# Patient Record
Sex: Male | Born: 1945 | Race: Black or African American | Hispanic: No | Marital: Married | Smoking: Former smoker
Health system: Southern US, Community
[De-identification: ages and names within clinical notes are randomized; demographics above are authoritative.]

## PROBLEM LIST (undated history)

## (undated) DIAGNOSIS — I10 Essential (primary) hypertension: Secondary | ICD-10-CM

## (undated) DIAGNOSIS — IMO0001 Reserved for inherently not codable concepts without codable children: Secondary | ICD-10-CM

## (undated) DIAGNOSIS — I2510 Atherosclerotic heart disease of native coronary artery without angina pectoris: Secondary | ICD-10-CM

## (undated) DIAGNOSIS — I499 Cardiac arrhythmia, unspecified: Secondary | ICD-10-CM

## (undated) DIAGNOSIS — K7460 Unspecified cirrhosis of liver: Secondary | ICD-10-CM

## (undated) DIAGNOSIS — I509 Heart failure, unspecified: Secondary | ICD-10-CM

## (undated) DIAGNOSIS — E7800 Pure hypercholesterolemia, unspecified: Secondary | ICD-10-CM

---
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
## 1998-03-10 ENCOUNTER — Ambulatory Visit (HOSPITAL_COMMUNITY): Admission: RE | Admit: 1998-03-10 | Discharge: 1998-03-10 | Payer: Self-pay | Admitting: Family Medicine

## 1998-08-23 ENCOUNTER — Encounter: Payer: Self-pay | Admitting: Emergency Medicine

## 1998-08-23 ENCOUNTER — Emergency Department (HOSPITAL_COMMUNITY): Admission: EM | Admit: 1998-08-23 | Discharge: 1998-08-23 | Payer: Self-pay | Admitting: Emergency Medicine

## 2000-11-13 ENCOUNTER — Ambulatory Visit (HOSPITAL_COMMUNITY): Admission: RE | Admit: 2000-11-13 | Discharge: 2000-11-13 | Payer: Self-pay | Admitting: Family Medicine

## 2000-11-13 ENCOUNTER — Encounter: Payer: Self-pay | Admitting: Family Medicine

## 2000-11-22 ENCOUNTER — Encounter: Payer: Self-pay | Admitting: Family Medicine

## 2000-11-22 ENCOUNTER — Ambulatory Visit (HOSPITAL_COMMUNITY): Admission: RE | Admit: 2000-11-22 | Discharge: 2000-11-22 | Payer: Self-pay | Admitting: Family Medicine

## 2000-12-28 ENCOUNTER — Inpatient Hospital Stay (HOSPITAL_COMMUNITY): Admission: EM | Admit: 2000-12-28 | Discharge: 2001-01-03 | Payer: Self-pay | Admitting: Emergency Medicine

## 2000-12-28 ENCOUNTER — Encounter: Payer: Self-pay | Admitting: Cardiology

## 2004-04-25 ENCOUNTER — Encounter: Admission: RE | Admit: 2004-04-25 | Discharge: 2004-07-24 | Payer: Self-pay | Admitting: Internal Medicine

## 2004-05-26 ENCOUNTER — Ambulatory Visit (HOSPITAL_COMMUNITY): Admission: RE | Admit: 2004-05-26 | Discharge: 2004-05-26 | Payer: Self-pay | Admitting: Gastroenterology

## 2007-01-23 ENCOUNTER — Emergency Department (HOSPITAL_COMMUNITY): Admission: EM | Admit: 2007-01-23 | Discharge: 2007-01-23 | Payer: Self-pay | Admitting: Emergency Medicine
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
## 2011-08-28 ENCOUNTER — Other Ambulatory Visit: Payer: Self-pay | Admitting: Cardiology

## 2011-09-08 ENCOUNTER — Other Ambulatory Visit: Payer: Self-pay | Admitting: Cardiology

## 2011-09-24 ENCOUNTER — Encounter: Payer: Self-pay | Admitting: *Deleted

## 2011-09-24 ENCOUNTER — Emergency Department (INDEPENDENT_AMBULATORY_CARE_PROVIDER_SITE_OTHER)
Admission: EM | Admit: 2011-09-24 | Discharge: 2011-09-24 | Disposition: A | Discharge status: Home / Self Care | Payer: Self-pay | Source: Home / Self Care | Attending: Emergency Medicine | Admitting: Emergency Medicine

## 2011-09-24 ENCOUNTER — Emergency Department (INDEPENDENT_AMBULATORY_CARE_PROVIDER_SITE_OTHER): Payer: Self-pay

## 2011-09-24 DIAGNOSIS — S6990XA Unspecified injury of unspecified wrist, hand and finger(s), initial encounter: Secondary | ICD-10-CM

## 2011-09-24 DIAGNOSIS — IMO0001 Reserved for inherently not codable concepts without codable children: Secondary | ICD-10-CM

## 2011-09-24 DIAGNOSIS — N342 Other urethritis: Secondary | ICD-10-CM

## 2011-09-24 HISTORY — DX: Heart failure, unspecified: I50.9

## 2011-09-24 HISTORY — DX: Cardiac arrhythmia, unspecified: I49.9

## 2011-09-24 HISTORY — DX: Atherosclerotic heart disease of native coronary artery without angina pectoris: I25.10

## 2011-09-24 HISTORY — DX: Essential (primary) hypertension: I10

## 2011-09-24 HISTORY — DX: Pure hypercholesterolemia, unspecified: E78.00

## 2011-09-24 MED ORDER — AZITHROMYCIN 250 MG PO TABS
1000.0000 mg | ORAL_TABLET | Freq: Once | ORAL | Status: AC
  Administered 2011-09-24: 1000 mg via ORAL

## 2011-09-24 MED ORDER — CEFIXIME 400 MG PO TABS
400.0000 mg | ORAL_TABLET | Freq: Once | ORAL | Status: AC
  Administered 2011-09-24: 400 mg via ORAL

## 2011-09-24 MED ORDER — AZITHROMYCIN 250 MG PO TABS
ORAL_TABLET | ORAL | Status: AC
  Filled 2011-09-24: qty 4

## 2011-09-24 MED ORDER — CEFIXIME 400 MG PO TABS
ORAL_TABLET | ORAL | Status: AC
  Filled 2011-09-24: qty 1
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
## 2011-09-24 NOTE — ED Notes (Signed)
Left ring finger injury x 2 months ago no treatment at time of injury per pt continued pain decreased ROM

## 2011-09-24 NOTE — ED Notes (Signed)
After triaging pt and leaving room pt stated he also wanted to be seen for penile discharge

## 2011-09-25 LAB — GC/CHLAMYDIA PROBE AMP, GENITAL
Chlamydia, DNA Probe: NEGATIVE
GC Probe Amp, Genital: NEGATIVE

## 2012-02-19 ENCOUNTER — Emergency Department (HOSPITAL_COMMUNITY)
Admission: EM | Admit: 2012-02-19 | Discharge: 2012-02-19 | Disposition: A | Discharge status: Home / Self Care | Payer: Federal, State, Local not specified - PPO | Source: Home / Self Care | Attending: Family Medicine | Admitting: Family Medicine

## 2012-02-19 ENCOUNTER — Encounter (HOSPITAL_COMMUNITY): Payer: Self-pay | Admitting: Cardiology

## 2012-02-19 DIAGNOSIS — L739 Follicular disorder, unspecified: Secondary | ICD-10-CM

## 2012-02-19 DIAGNOSIS — L738 Other specified follicular disorders: Secondary | ICD-10-CM

## 2012-02-19 MED ORDER — SULFAMETHOXAZOLE-TRIMETHOPRIM 800-160 MG PO TABS: 1.0000 | ORAL_TABLET | Freq: Two times a day (BID) | ORAL | Status: AC

## 2012-02-19 NOTE — ED Notes (Signed)
Pr reports raised rash to chin that started 3 days ago. Area is noted to be raised with one area to the center of the chin that is open that the pt removed with fingers. Pt denies itching to the area. One raised area to the right groin that is healing that had the same appearance as rash to face. Denies fever.

## 2012-02-19 NOTE — ED Provider Notes (Signed)
History     CSN: [id number]  Arrival date & time 02/19/12  1110   First MD Initiated Contact with Patient 02/19/12 1318      Chief Complaint  Patient presents with  . Rash    (Consider location/radiation/quality/duration/timing/severity/associated sxs/prior treatment) HPI Comments: The patient reports having a skin infection on his chin and groin. Noted 2 pustules 3 days ago. No pain. States the one on his chin drained. No known hx of mrsa. No tx pta.   The history is provided by the patient.    Past Medical History  Diagnosis Date  . Hypertension   . Diabetes mellitus   . Coronary artery disease   . CHF (congestive heart failure)   . Irregular heartbeat   . High cholesterol     Past Surgical History  Procedure Date  . Tonsillectomy 1965    History reviewed. No pertinent family history.  History  Substance Use Topics  . Smoking status: Former Smoker    Quit date: 12/05/1967  . Smokeless tobacco: Not on file  . Alcohol Use: Yes     occas      Review of Systems  Constitutional: Negative.   HENT: Negative.   Cardiovascular: Negative.   Gastrointestinal: Negative.   Genitourinary: Negative.     Allergies  Review of patient's allergies indicates no known allergies.  Home Medications   Current Outpatient Rx  Name Route Sig Dispense Refill  . ASPIRIN 81 MG PO TABS Oral Take 81 mg by mouth daily.      Marland Kitchen CARVEDILOL PHOSPHATE ER 80 MG PO CP24 Oral Take 80 mg by mouth daily.      Marland Kitchen DIGOXIN 0.25 MG PO TABS Oral Take 250 mcg by mouth daily.      . FUROSEMIDE 80 MG PO TABS Oral Take 80 mg by mouth 2 (two) times daily.      Marland Kitchen GLIPIZIDE 10 MG PO TABS Oral Take 10 mg by mouth 2 (two) times daily before a meal.      . METFORMIN HCL 500 MG PO TABS Oral Take 500 mg by mouth 2 (two) times daily with a meal.      . POTASSIUM CHLORIDE CRYS ER 20 MEQ PO TBCR Oral Take 20 mEq by mouth 2 (two) times daily.      Marland Kitchen RAMIPRIL 10 MG PO TABS Oral Take 10 mg by mouth daily.        Marland Kitchen ROSUVASTATIN CALCIUM 20 MG PO TABS Oral Take 20 mg by mouth daily.      . SULFAMETHOXAZOLE-TRIMETHOPRIM 800-160 MG PO TABS Oral Take 1 tablet by mouth every 12 (twelve) hours. 20 tablet 0    There were no vitals taken for this visit.  Physical Exam  Nursing note and vitals reviewed. Constitutional: He appears well-developed and well-nourished. No distress.  HENT:  Head: Normocephalic and atraumatic.       Small scabbed lesion on chin.   Neck: Normal range of motion. Neck supple.  Cardiovascular: Normal rate and regular rhythm.   Pulmonary/Chest: Breath sounds normal.  Skin: Skin is warm and dry.       Small red raised area consistent with folliculitis right scrotum    ED Course  Procedures (including critical care time)  Labs Reviewed - No data to display No results found.   1. Folliculitis       MDM          Randa Spike, MD 02/19/12 [phone]

## 2012-02-19 NOTE — Discharge Instructions (Signed)
Apply antibiotic ointment to chin. Follow up if symptoms persist or worsen.

## 2012-02-21 ENCOUNTER — Telehealth (HOSPITAL_COMMUNITY): Payer: Self-pay | Admitting: *Deleted

## 2012-02-21 NOTE — ED Notes (Addendum)
Pt. called on VM and wanted to speak to Dr. Lorenza Chick. 1426  I called pt. back and he said his partner was diagnosed with trich. yesterday and wanted to ask Dr. Lorenza Chick for a Rx. to treat it. I asked what he was seen for and he said he was given Sulfamethoxazole for sores on his face. I told him that is a completely different problem than what we saw him for and he would have to be seen again. Pt. said he just paid a copay on Mon. Of $40.00. I told him I would ask the doctor and call him back. Discussed with Dr. Artis Flock and he said pt. can f/u at the [facility]. I called pt. back and gave him this information. I told him he could also come back here. [name] 02/21/2012

## 2012-12-16 ENCOUNTER — Emergency Department (INDEPENDENT_AMBULATORY_CARE_PROVIDER_SITE_OTHER): Payer: Federal, State, Local not specified - PPO

## 2012-12-16 ENCOUNTER — Emergency Department (HOSPITAL_COMMUNITY)
Admission: EM | Admit: 2012-12-16 | Discharge: 2012-12-16 | Disposition: A | Discharge status: Home / Self Care | Payer: Federal, State, Local not specified - PPO | Source: Home / Self Care | Attending: Emergency Medicine | Admitting: Emergency Medicine

## 2012-12-16 ENCOUNTER — Encounter (HOSPITAL_COMMUNITY): Payer: Self-pay | Admitting: *Deleted

## 2012-12-16 DIAGNOSIS — N490 Inflammatory disorders of seminal vesicle: Secondary | ICD-10-CM

## 2012-12-16 DIAGNOSIS — R6882 Decreased libido: Secondary | ICD-10-CM

## 2012-12-16 LAB — POCT I-STAT, CHEM 8
Calcium, Ion: 1.12 mmol/L — ABNORMAL LOW (ref 1.13–1.30)
Chloride: 101 mEq/L (ref 96–112)
HCT: 42 % (ref 39.0–52.0)
Hemoglobin: 14.3 g/dL (ref 13.0–17.0)
TCO2: 35 mmol/L (ref 0–100)

## 2012-12-16 LAB — POCT URINALYSIS DIP (DEVICE)
Bilirubin Urine: NEGATIVE
Leukocytes, UA: NEGATIVE
Nitrite: NEGATIVE
Urobilinogen, UA: 0.2 mg/dL (ref 0.0–1.0)
pH: 5.5 (ref 5.0–8.0)

## 2012-12-16 MED ORDER — TRAMADOL HCL 50 MG PO TABS: 100.0000 mg | ORAL_TABLET | Freq: Three times a day (TID) | ORAL | Status: DC | PRN

## 2012-12-16 MED ORDER — METHOCARBAMOL 500 MG PO TABS: 500.0000 mg | ORAL_TABLET | Freq: Three times a day (TID) | ORAL | Status: DC

## 2012-12-16 NOTE — ED Provider Notes (Signed)
Chief Complaint:   Chief Complaint  Patient presents with  . Back Pain    History of Present Illness:   Geoffrey [name] is a 67 year old gentleman who recalls that 2 months ago he had an episode of bright red blood in his ejaculate. He denies any pain with ejaculation, dysuria, frequency, urgency, hesitancy, decrease in his urinary stream, or hematuria. Ever since that time he's had pain in his back which began the upper back and seems to down to lower back. He describes it as a back spasm or muscle spasm. Is worse in the morning when he first wakes up and radiates around to both flank areas. His left leg feels numb in the morning but then this gets better. The pain does not radiate down into the leg. There is no weakness. The pain is better with massage. He's also noted since then decrease in his libido. He denies any difficulty getting an erection.  Review of Systems:  Other than noted above, the patient denies any of the following symptoms: Systemic:  No fever, chills, severe fatigue, or unexplained weight loss. GI:  No abdominal pain, nausea, vomiting, diarrhea, constipation, incontinence of bowel, or blood in stool. GU:  No dysuria, frequency, urgency, or hematuria. No incontinence of urine or difficulty urinating.  M-S:  No neck pain, joint pain, arthritis, or myalgias. Neuro:  No paresthesias, saddle anesthesia, muscular weakness, or progressive neurological deficit.  PMFSH:  Past medical history, family history, social history, meds, and allergies were reviewed. Specifically, there is no history of cancer, major trauma, osteoporosis, immunosuppression, HIV, or IV or injection drug use. He has diabetes and congestive heart failure and is on a number of meds including aspirin, carvedilol, digoxin, furosemide, glipizide, metformin, ramipril, potassium, and Crestor.  Physical Exam:   Vital signs:  BP 152/76  Pulse 78  Temp(Src) 98.6 F (37 C)  Resp 18  SpO2 99% General:  Alert, oriented,  in no distress. Lungs: Clear to auscultation. Heart: Rate rhythm, no gallop or murmur. Abdomen:  Soft, non-tender.  No organomegaly or mass.  No pulsatile midline abdominal mass or bruit. Rectal exam: No tenderness or mass, prostate was normal without any nodules or tenderness, and stool was heme-negative. Back:  Exam of the back reveals no tenderness to palpation, with full range of motion, minimal pain with flexion, negative straight leg raising. Neuro:  Normal muscle strength, sensations and DTRs. Extremities: Pedal pulses were full, there was no edema. Skin:  Clear, warm and dry.  No rash.  Labs:   Results for orders placed during the hospital encounter of 12/16/12  OCCULT BLOOD, POC DEVICE      Result Value Range   Fecal Occult Bld NEGATIVE  NEGATIVE  POCT URINALYSIS DIP (DEVICE)      Result Value Range   Glucose, UA NEGATIVE  NEGATIVE mg/dL   Bilirubin Urine NEGATIVE  NEGATIVE   Ketones, ur TRACE (*) NEGATIVE mg/dL   Specific Gravity, Urine 1.015  1.005 - 1.030   Hgb urine dipstick NEGATIVE  NEGATIVE   pH 5.5  5.0 - 8.0   Protein, ur 30 (*) NEGATIVE mg/dL   Urobilinogen, UA 0.2  0.0 - 1.0 mg/dL   Nitrite NEGATIVE  NEGATIVE   Leukocytes, UA NEGATIVE  NEGATIVE  POCT I-STAT, CHEM 8      Result Value Range   Sodium 143  135 - 145 mEq/L   Potassium 4.1  3.5 - 5.1 mEq/L   Chloride 101  96 - 112 mEq/L   BUN  18  6 - 23 mg/dL   Creatinine, Ser 7.82 (*) 0.50 - 1.35 mg/dL   Glucose, Bld 956 (*) 70 - 99 mg/dL   Calcium, Ion 2.13 (*) 1.13 - 1.30 mmol/L   TCO2 35  0 - 100 mmol/L   Hemoglobin 14.3  13.0 - 17.0 g/dL   HCT 08.6  57.8 - 46.9 %     Radiology:  Dg Lumbar Spine Complete  12/16/2012  *RADIOLOGY REPORT*  Clinical Data: Low back pain for the past 2 months.  No known injury.  LUMBAR SPINE - COMPLETE 4+ VIEW  Comparison: None.  Findings: Five non-rib bearing lumbar vertebrae.  Marked disc space narrowing with a vacuum phenomenon and mild to moderate anterior spur formation at  the L5-S1 level.  There are also facet degenerative changes at the L4-5 and L5-S1 levels with associated grade 1 spondylolisthesis at the L4-5 level.  No pars defects are seen.  Atheromatous arterial calcifications without visible aneurysm.  Lower thoracic spine degenerative changes.  IMPRESSION: Degenerative changes, as described above.   Original Report Authenticated By: Beckie Salts, M.D.     Assessment:  The primary encounter diagnosis was Degenerative disc disease, lumbar. Diagnoses of Decreased libido and Seminal vesiculitis were also pertinent to this visit.  His back pain appears to be due to degenerative disc disease. He has almost no disc left at his L5-S1 level. Additionally, he appears to have similar cellulitis as a cause for his hematospermia. 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  Plan:   1.  The following meds were prescribed:   Discharge Medication List as of 12/16/2012  4:54 PM    START taking these medications   Details  methocarbamol (ROBAXIN) 500 MG tablet Take 1 tablet (500 mg total) by mouth 3 (three) times daily., Starting 12/16/2012, Until Discontinued, Normal    traMADol (ULTRAM) 50 MG tablet Take 2 tablets (100 mg total) by mouth every 8 (eight) hours as needed for pain., Starting 12/16/2012, Until Discontinued, Normal       2.  The patient was instructed in symptomatic care and handouts were given. 3.  The patient was told to return if becoming worse in any way, if no better in 2 weeks, and given some red flag symptoms including fever, worsening pain, or neurological symptoms that would indicate earlier return. 4.  The patient was encouraged to try to be as active as possible and given some exercises to do followed by moist heat.  Follow up:  The patient was told to follow up with Dr. Annell Greening for his back problem and Dr. Margaretmary Bayley for his libido deficiency.     Reuben Likes,  MD 12/16/12 [phone]

## 2012-12-16 NOTE — ED Notes (Signed)
Geoffrey [name]  Reports  Symptoms  Of  Back  Pain  With          The  Pain  In the  r  Upper  Part   Of  His  Back    He  Ambulates  With a  Steady  Fluid  Gait  He  Is  Sitting uprigght on the  Exam table  He  Is  Speaking in complete  sentances   He  Is  In no   severe  Distress

## 2014-12-15 ENCOUNTER — Inpatient Hospital Stay (HOSPITAL_COMMUNITY)
Admission: EM | Admit: 2014-12-15 | Discharge: 2014-12-22 | DRG: 292 | Disposition: A | Discharge status: Home / Self Care | Payer: Medicare Other | Attending: Cardiology | Admitting: Cardiology

## 2014-12-15 ENCOUNTER — Encounter (HOSPITAL_COMMUNITY): Payer: Self-pay | Admitting: Emergency Medicine

## 2014-12-15 DIAGNOSIS — E785 Hyperlipidemia, unspecified: Secondary | ICD-10-CM | POA: Diagnosis not present

## 2014-12-15 DIAGNOSIS — I5023 Acute on chronic systolic (congestive) heart failure: Secondary | ICD-10-CM | POA: Diagnosis present

## 2014-12-15 DIAGNOSIS — N189 Chronic kidney disease, unspecified: Secondary | ICD-10-CM

## 2014-12-15 DIAGNOSIS — D638 Anemia in other chronic diseases classified elsewhere: Secondary | ICD-10-CM | POA: Diagnosis present

## 2014-12-15 DIAGNOSIS — K7031 Alcoholic cirrhosis of liver with ascites: Secondary | ICD-10-CM | POA: Diagnosis not present

## 2014-12-15 DIAGNOSIS — D689 Coagulation defect, unspecified: Secondary | ICD-10-CM | POA: Diagnosis present

## 2014-12-15 DIAGNOSIS — I2510 Atherosclerotic heart disease of native coronary artery without angina pectoris: Secondary | ICD-10-CM | POA: Diagnosis not present

## 2014-12-15 DIAGNOSIS — N289 Disorder of kidney and ureter, unspecified: Secondary | ICD-10-CM

## 2014-12-15 DIAGNOSIS — I129 Hypertensive chronic kidney disease with stage 1 through stage 4 chronic kidney disease, or unspecified chronic kidney disease: Secondary | ICD-10-CM | POA: Diagnosis present

## 2014-12-15 DIAGNOSIS — F1290 Cannabis use, unspecified, uncomplicated: Secondary | ICD-10-CM | POA: Diagnosis not present

## 2014-12-15 DIAGNOSIS — Z23 Encounter for immunization: Secondary | ICD-10-CM | POA: Diagnosis not present

## 2014-12-15 DIAGNOSIS — N179 Acute kidney failure, unspecified: Secondary | ICD-10-CM | POA: Diagnosis present

## 2014-12-15 DIAGNOSIS — N183 Chronic kidney disease, stage 3 (moderate): Secondary | ICD-10-CM | POA: Diagnosis not present

## 2014-12-15 DIAGNOSIS — K766 Portal hypertension: Secondary | ICD-10-CM | POA: Diagnosis present

## 2014-12-15 DIAGNOSIS — E876 Hypokalemia: Secondary | ICD-10-CM | POA: Diagnosis not present

## 2014-12-15 DIAGNOSIS — E11649 Type 2 diabetes mellitus with hypoglycemia without coma: Secondary | ICD-10-CM | POA: Diagnosis not present

## 2014-12-15 DIAGNOSIS — I420 Dilated cardiomyopathy: Secondary | ICD-10-CM | POA: Diagnosis present

## 2014-12-15 DIAGNOSIS — Z7982 Long term (current) use of aspirin: Secondary | ICD-10-CM

## 2014-12-15 DIAGNOSIS — F1010 Alcohol abuse, uncomplicated: Secondary | ICD-10-CM | POA: Diagnosis not present

## 2014-12-15 DIAGNOSIS — M7989 Other specified soft tissue disorders: Secondary | ICD-10-CM | POA: Diagnosis present

## 2014-12-15 DIAGNOSIS — I5021 Acute systolic (congestive) heart failure: Secondary | ICD-10-CM | POA: Diagnosis present

## 2014-12-15 DIAGNOSIS — Z87891 Personal history of nicotine dependence: Secondary | ICD-10-CM | POA: Diagnosis not present

## 2014-12-15 HISTORY — DX: Reserved for inherently not codable concepts without codable children: IMO0001

## 2014-12-15 HISTORY — DX: Unspecified cirrhosis of liver: K74.60

## 2014-12-15 LAB — CBC WITH DIFFERENTIAL/PLATELET
BASOS ABS: 0.1 10*3/uL (ref 0.0–0.1)
BASOS PCT: 1 % (ref 0–1)
EOS ABS: 0 10*3/uL (ref 0.0–0.7)
EOS PCT: 0 % (ref 0–5)
HEMATOCRIT: 29.8 % — AB (ref 39.0–52.0)
Hemoglobin: 8.6 g/dL — ABNORMAL LOW (ref 13.0–17.0)
Lymphocytes Relative: 17 % (ref 12–46)
Lymphs Abs: 1.1 10*3/uL (ref 0.7–4.0)
MCH: 22.6 pg — AB (ref 26.0–34.0)
MCHC: 28.9 g/dL — AB (ref 30.0–36.0)
MCV: 78.2 fL (ref 78.0–100.0)
MONO ABS: 0.5 10*3/uL (ref 0.1–1.0)
Monocytes Relative: 8 % (ref 3–12)
NEUTROS ABS: 4.7 10*3/uL (ref 1.7–7.7)
Neutrophils Relative %: 74 % (ref 43–77)
Platelets: 218 10*3/uL (ref 150–400)
RBC: 3.81 MIL/uL — ABNORMAL LOW (ref 4.22–5.81)
RDW: 18.3 % — AB (ref 11.5–15.5)
WBC: 6.4 10*3/uL (ref 4.0–10.5)

## 2014-12-15 LAB — COMPREHENSIVE METABOLIC PANEL
ALK PHOS: 104 U/L (ref 39–117)
ALT: 525 U/L — AB (ref 0–53)
AST: 826 U/L — AB (ref 0–37)
Albumin: 2.8 g/dL — ABNORMAL LOW (ref 3.5–5.2)
Anion gap: 10 (ref 5–15)
BUN: 27 mg/dL — ABNORMAL HIGH (ref 6–23)
CO2: 26 mmol/L (ref 19–32)
Calcium: 8.3 mg/dL — ABNORMAL LOW (ref 8.4–10.5)
Chloride: 106 mmol/L (ref 96–112)
Creatinine, Ser: 2.15 mg/dL — ABNORMAL HIGH (ref 0.50–1.35)
GFR calc non Af Amer: 30 mL/min — ABNORMAL LOW (ref >90)
GFR, EST AFRICAN AMERICAN: 35 mL/min — AB (ref >90)
GLUCOSE: 69 mg/dL — AB (ref 70–99)
POTASSIUM: 3.9 mmol/L (ref 3.5–5.1)
SODIUM: 142 mmol/L (ref 135–145)
TOTAL PROTEIN: 6.8 g/dL (ref 6.0–8.3)
Total Bilirubin: 2.1 mg/dL — ABNORMAL HIGH (ref 0.3–1.2)

## 2014-12-15 LAB — POC OCCULT BLOOD, ED: FECAL OCCULT BLD: NEGATIVE

## 2014-12-15 LAB — MAGNESIUM: Magnesium: 2.5 mg/dL (ref 1.5–2.5)

## 2014-12-15 LAB — CBG MONITORING, ED
GLUCOSE-CAPILLARY: 43 mg/dL — AB (ref 70–99)
GLUCOSE-CAPILLARY: 117 mg/dL — AB (ref 70–99)

## 2014-12-15 MED ORDER — DEXTROSE 50 % IV SOLN
50.0000 mL | Freq: Once | INTRAVENOUS | Status: AC
  Administered 2014-12-15: 50 mL via INTRAVENOUS
  Filled 2014-12-15: qty 50

## 2014-12-15 NOTE — H&P (Signed)
Geoffrey [name] is an 69 y.o. male.   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 Patient gives history of PND orthopnea. Denies any chest pain nausea vomiting diaphoresis. Denies abdominal pain. Patient was noted to be hypoglycemic with blood sugar in 40s and also was noted to have hemoglobin of 8.6 which was significant drop from 14.3 in March 2014. Patient denies any abdominal pain. Denies any hematuria or bright red blood per rectum or melena. Denies any hemoptysis.  Past Medical History  Diagnosis Date  . Hypertension   . Diabetes mellitus   . Coronary artery disease   . CHF (congestive heart failure)   . Irregular heartbeat   . High cholesterol     Past Surgical History  Procedure Laterality Date  . Tonsillectomy  1965    No family history on file. Social History:  reports that he quit smoking about 47 years ago. He does not have any smokeless tobacco history on file. He reports that he drinks alcohol. He reports that he uses illicit drugs (Marijuana) about 3 times per week.  Allergies: No Known Allergies   (Not in a hospital admission)  Results for orders placed or performed during the hospital encounter of 12/15/14 (from the past 48 hour(s))  CBG monitoring, ED     Status: Abnormal   Collection Time: 12/15/14  8:52 PM  Result Value Ref Range   Glucose-Capillary 43 (LL) 70 - 99 mg/dL  CBC with Differential     Status: Abnormal   Collection Time: 12/15/14  9:05 PM  Result  Value Ref Range   WBC 6.4 4.0 - 10.5 K/uL   RBC 3.81 (L) 4.22 - 5.81 MIL/uL   Hemoglobin 8.6 (L) 13.0 - 17.0 g/dL   HCT 29.8 (L) 39.0 - 52.0 %   MCV 78.2 78.0 - 100.0 fL   MCH 22.6 (L) 26.0 - 34.0 pg   MCHC 28.9 (L) 30.0 - 36.0 g/dL   RDW 18.3 (H) 11.5 - 15.5 %   Platelets 218 150 - 400 K/uL   Neutrophils Relative % 74 43 - 77 %   Neutro Abs 4.7 1.7 - 7.7 K/uL   Lymphocytes Relative 17 12 - 46 %   Lymphs Abs 1.1 0.7 - 4.0 K/uL   Monocytes Relative 8 3 - 12 %   Monocytes Absolute 0.5 0.1 - 1.0 K/uL   Eosinophils Relative 0 0 - 5 %   Eosinophils Absolute 0.0 0.0 - 0.7 K/uL   Basophils Relative 1 0 - 1 %   Basophils Absolute 0.1 0.0 - 0.1 K/uL  Comprehensive metabolic panel     Status: Abnormal   Collection Time: 12/15/14  9:05 PM  Result Value Ref Range   Sodium 142 135 - 145 mmol/L   Potassium 3.9 3.5 - 5.1 mmol/L   Chloride 106 96 - 112 mmol/L   CO2 26 19 - 32 mmol/L   Glucose, Bld 69 (L) 70 - 99 mg/dL   BUN 27 (H) 6 - 23 mg/dL   Creatinine, Ser 2.15 (H) 0.50 - 1.35 mg/dL   Calcium 8.3 (L) 8.4 - 10.5 mg/dL  Total Protein 6.8 6.0 - 8.3 g/dL   Albumin 2.8 (L) 3.5 - 5.2 g/dL   AST 826 (H) 0 - 37 U/L   ALT 525 (H) 0 - 53 U/L   Alkaline Phosphatase 104 39 - 117 U/L   Total Bilirubin 2.1 (H) 0.3 - 1.2 mg/dL   GFR calc non Af Amer 30 (L) >90 mL/min   GFR calc Af Amer 35 (L) >90 mL/min    Comment: (NOTE) The eGFR has been calculated using the CKD EPI equation. This calculation has not been validated in all clinical situations. eGFR's persistently <90 mL/min signify possible Chronic Kidney Disease.    Anion gap 10 5 - 15  Magnesium     Status: None   Collection Time: 12/15/14  9:05 PM  Result Value Ref Range   Magnesium 2.5 1.5 - 2.5 mg/dL  POC CBG, ED     Status: Abnormal   Collection Time: 12/15/14 10:06 PM  Result Value Ref Range   Glucose-Capillary 117 (H) 70 - 99 mg/dL   No results found.  Review of Systems  Constitutional: Negative for fever and chills.  HENT:  Negative for hearing loss.   Eyes: Negative for blurred vision and double vision.  Respiratory: Positive for cough and shortness of breath.   Cardiovascular: Positive for orthopnea, leg swelling and PND. Negative for chest pain and palpitations.  Gastrointestinal: Negative for nausea, vomiting and abdominal pain.  Genitourinary: Negative for dysuria.  Neurological: Negative for dizziness and headaches.    Blood pressure 117/67, pulse 79, temperature 97.8 F (36.6 C), temperature source Oral, resp. rate 18, height 5' 6"  (1.676 m), weight 74.39 kg (164 lb), SpO2 99 %. Physical Exam  Eyes: Pupils are equal, round, and reactive to light. Left eye exhibits no discharge. No scleral icterus.  Neck: Normal range of motion. Neck supple. JVD present. No tracheal deviation present.  Cardiovascular: Normal rate and regular rhythm.   Murmur (Soft systolic murmur and S3 gallop noted) heard. Respiratory:  Bibasilar rales noted  GI: Soft. Bowel sounds are normal. He exhibits no distension. There is no tenderness. There is no rebound.  Musculoskeletal:  No clubbing cyanosis 4+ edema noted     Assessment/Plan Acute decompensated systolic heart failure Severe nonischemic dilated cardiomyopathy Hypertension Diabetes mellitus Status post hypoglycemia EtOH abuse rule out cirrhosis of liver Elevated LFTs secondary to above Acute on chronic kidney injury Acute anemia questionable etiology rule out GI loss History of tobacco abuse Plan As per orders   [facility] N 12/15/2014, 10:17 PM

## 2014-12-15 NOTE — ED Notes (Signed)
Pt CBG 43. MD RN notified.

## 2014-12-15 NOTE — ED Notes (Signed)
[place] sandwich, apple sauce, apple juice, crackers, and peanut butter given to patient.

## 2014-12-15 NOTE — ED Notes (Signed)
Upon arrival nurse tech obtained CBG 43 notified EDP.

## 2014-12-15 NOTE — ED Notes (Signed)
Explained patient patient the need to get actual weight for patient verbalized understanding.

## 2014-12-15 NOTE — ED Notes (Signed)
Onset 4 weeks ago developed edema bilateral lower extremities. Doctor 3 weeks ago told patient to take an extra lasix pill.  Continue to have bilateral lower extremity edema. EMS reported patient blood sugar 37 gave instant glucose glucose increased to 47. Upon arrival glucose 43 EDP notified.

## 2014-12-15 NOTE — ED Provider Notes (Signed)
CSN: [id number]     Arrival date & time 12/15/14  2050 History   First MD Initiated Contact with Patient 12/15/14 2053     No chief complaint on file.    (Consider location/radiation/quality/duration/timing/severity/associated sxs/prior Treatment) HPI   69 y/o male w/ PMH (HTN, DM, CAD, CHF, HL) who comes in with complaint of bilateral lower extremity swelling for the past three weeks.    Patient has been seen pcp who had him take an extra dose of lasix two weeks ago, despite this, he has been continuing to have some worsening LE edema bilaterally.  Patient denies chest pain/shortness of breath, fever chills, increased skin redness/warmth, or other symptoms.  Past Medical History  Diagnosis Date  . Hypertension   . Diabetes mellitus   . Coronary artery disease   . CHF (congestive heart failure)   . Irregular heartbeat   . High cholesterol    Past Surgical History  Procedure Laterality Date  . Tonsillectomy  1965   No family history on file. History  Substance Use Topics  . Smoking status: Former Smoker    Quit date: 12/05/1967  . Smokeless tobacco: Not on file  . Alcohol Use: Yes     Comment: occas    Review of Systems  Constitutional: Negative for fever and chills.  Eyes: Negative for redness.  Respiratory: Negative for cough and shortness of breath.   Cardiovascular: Positive for leg swelling. Negative for chest pain.  Gastrointestinal: Negative for nausea, vomiting, abdominal pain and diarrhea.  Genitourinary: Negative for dysuria.  Skin: Negative for rash.  Neurological: Negative for headaches.  All other systems reviewed and are negative.     Allergies  Review of patient's allergies indicates no known allergies.  Home Medications   Prior to Admission medications   Medication Sig Start Date End Date Taking? Authorizing Provider  aspirin 81 MG tablet Take 81 mg by mouth daily.      Historical Provider, MD  carvedilol (COREG CR) 80 MG 24 hr capsule Take  80 mg by mouth daily.      Historical Provider, MD  digoxin (LANOXIN) 0.25 MG tablet Take 250 mcg by mouth daily.      Historical Provider, MD  furosemide (LASIX) 80 MG tablet Take 80 mg by mouth 2 (two) times daily.      Historical Provider, MD  glipiZIDE (GLUCOTROL) 10 MG tablet Take 10 mg by mouth 2 (two) times daily before a meal.      Historical Provider, MD  metFORMIN (GLUCOPHAGE) 500 MG tablet Take 500 mg by mouth 2 (two) times daily with a meal.      Historical Provider, MD  methocarbamol (ROBAXIN) 500 MG tablet Take 1 tablet (500 mg total) by mouth 3 (three) times daily. 12/16/12   Reuben Likes, MD  potassium chloride SA (K-DUR,KLOR-CON) 20 MEQ tablet Take 20 mEq by mouth 2 (two) times daily.      Historical Provider, MD  ramipril (ALTACE) 10 MG tablet Take 10 mg by mouth daily.      Historical Provider, MD  rosuvastatin (CRESTOR) 20 MG tablet Take 20 mg by mouth daily.      Historical Provider, MD  traMADol (ULTRAM) 50 MG tablet Take 2 tablets (100 mg total) by mouth every 8 (eight) hours as needed for pain. 12/16/12   Reuben Likes, MD   BP 128/76 mmHg  Pulse 82  Temp(Src) 97.8 F (36.6 C) (Oral)  Resp 18  SpO2 98% Physical Exam  Constitutional: He is  oriented to person, place, and time. No distress.  HENT:  Head: Normocephalic and atraumatic.  Eyes: EOM are normal. Pupils are equal, round, and reactive to light.  Neck: Normal range of motion. Neck supple.  Cardiovascular: Normal rate.   Pulmonary/Chest: Effort normal. No respiratory distress. He has no rales.  Abdominal: Soft. There is no tenderness.  Musculoskeletal: Normal range of motion. He exhibits edema (3+ bilateral lower extremity swelling.  no increased skin warmth, no skin erythema.).  Neurological: He is alert and oriented to person, place, and time.  Skin: No rash noted. He is not diaphoretic.  Psychiatric: He has a normal mood and affect.    ED Course  Procedures (including critical care time) Labs  Review Labs Reviewed  CBC WITH DIFFERENTIAL/PLATELET  COMPREHENSIVE METABOLIC PANEL  MAGNESIUM    Imaging Review No results found.   EKG Interpretation None      MDM   Final diagnoses:  None    69 y/o male w/ PMH (HTN, DM, CAD, CHF, HL) who comes in with complaint of bilateral lower extremity swelling for the past three weeks.   No other sx  Exam as above, pitting edema to the bilateral LE.  No signs of infection  Cbc w/ hgb below baseline.  Patient HDS.  Patient reports no bleeding events, no blood in stool, no black stools.  FOBT neg.  Patient's pcp has seen the patient and would like to admit for further eval of the anemia.  Patient is stable at this time.  Arrangements made for admission.     Silas FloodErik Kitti Mcclish, MD 12/16/14 [phone]  Mirian MoMatthew Gentry, MD 12/16/14 [phone]

## 2014-12-16 ENCOUNTER — Encounter (HOSPITAL_COMMUNITY): Payer: Self-pay | Admitting: General Practice

## 2014-12-16 ENCOUNTER — Inpatient Hospital Stay (HOSPITAL_COMMUNITY): Payer: Medicare Other

## 2014-12-16 LAB — COMPREHENSIVE METABOLIC PANEL
ALT: 501 U/L — ABNORMAL HIGH (ref 0–53)
AST: 728 U/L — ABNORMAL HIGH (ref 0–37)
Albumin: 2.6 g/dL — ABNORMAL LOW (ref 3.5–5.2)
Alkaline Phosphatase: 100 U/L (ref 39–117)
Anion gap: 11 (ref 5–15)
BUN: 28 mg/dL — ABNORMAL HIGH (ref 6–23)
CALCIUM: 8.3 mg/dL — AB (ref 8.4–10.5)
CO2: 27 mmol/L (ref 19–32)
Chloride: 104 mmol/L (ref 96–112)
Creatinine, Ser: 2.13 mg/dL — ABNORMAL HIGH (ref 0.50–1.35)
GFR calc Af Amer: 35 mL/min — ABNORMAL LOW (ref >90)
GFR, EST NON AFRICAN AMERICAN: 31 mL/min — AB (ref >90)
GLUCOSE: 143 mg/dL — AB (ref 70–99)
Potassium: 3.5 mmol/L (ref 3.5–5.1)
Sodium: 142 mmol/L (ref 135–145)
Total Bilirubin: 1.7 mg/dL — ABNORMAL HIGH (ref 0.3–1.2)
Total Protein: 5.8 g/dL — ABNORMAL LOW (ref 6.0–8.3)

## 2014-12-16 LAB — CBC WITH DIFFERENTIAL/PLATELET
BASOS ABS: 0.1 10*3/uL (ref 0.0–0.1)
Basophils Relative: 1 % (ref 0–1)
EOS PCT: 0 % (ref 0–5)
Eosinophils Absolute: 0 10*3/uL (ref 0.0–0.7)
HCT: 27.1 % — ABNORMAL LOW (ref 39.0–52.0)
Hemoglobin: 7.8 g/dL — ABNORMAL LOW (ref 13.0–17.0)
LYMPHS ABS: 1 10*3/uL (ref 0.7–4.0)
Lymphocytes Relative: 19 % (ref 12–46)
MCH: 22.5 pg — ABNORMAL LOW (ref 26.0–34.0)
MCHC: 28.8 g/dL — ABNORMAL LOW (ref 30.0–36.0)
MCV: 78.1 fL (ref 78.0–100.0)
Monocytes Absolute: 0.5 10*3/uL (ref 0.1–1.0)
Monocytes Relative: 9 % (ref 3–12)
NEUTROS PCT: 71 % (ref 43–77)
Neutro Abs: 3.8 10*3/uL (ref 1.7–7.7)
Platelets: 214 10*3/uL (ref 150–400)
RBC: 3.47 MIL/uL — AB (ref 4.22–5.81)
RDW: 18.3 % — AB (ref 11.5–15.5)
WBC: 5.3 10*3/uL (ref 4.0–10.5)

## 2014-12-16 LAB — BASIC METABOLIC PANEL
Anion gap: 10 (ref 5–15)
BUN: 26 mg/dL — AB (ref 6–23)
CO2: 26 mmol/L (ref 19–32)
Calcium: 8.2 mg/dL — ABNORMAL LOW (ref 8.4–10.5)
Chloride: 105 mmol/L (ref 96–112)
Creatinine, Ser: 2 mg/dL — ABNORMAL HIGH (ref 0.50–1.35)
GFR calc Af Amer: 38 mL/min — ABNORMAL LOW (ref >90)
GFR, EST NON AFRICAN AMERICAN: 33 mL/min — AB (ref >90)
Glucose, Bld: 84 mg/dL (ref 70–99)
POTASSIUM: 3.3 mmol/L — AB (ref 3.5–5.1)
SODIUM: 141 mmol/L (ref 135–145)

## 2014-12-16 LAB — PROTIME-INR
INR: 1.77 — AB (ref 0.00–1.49)
PROTHROMBIN TIME: 20.8 s — AB (ref 11.6–15.2)

## 2014-12-16 LAB — GLUCOSE, CAPILLARY
GLUCOSE-CAPILLARY: 116 mg/dL — AB (ref 70–99)
Glucose-Capillary: 95 mg/dL (ref 70–99)
Glucose-Capillary: 160 mg/dL — ABNORMAL HIGH (ref 70–99)
Glucose-Capillary: 112 mg/dL — ABNORMAL HIGH (ref 70–99)

## 2014-12-16 LAB — CBC
HEMATOCRIT: 27.8 % — AB (ref 39.0–52.0)
Hemoglobin: 8 g/dL — ABNORMAL LOW (ref 13.0–17.0)
MCH: 22.5 pg — ABNORMAL LOW (ref 26.0–34.0)
MCHC: 28.8 g/dL — AB (ref 30.0–36.0)
MCV: 78.3 fL (ref 78.0–100.0)
PLATELETS: 200 10*3/uL (ref 150–400)
RBC: 3.55 MIL/uL — ABNORMAL LOW (ref 4.22–5.81)
RDW: 18.2 % — AB (ref 11.5–15.5)
WBC: 5.6 10*3/uL (ref 4.0–10.5)

## 2014-12-16 LAB — TSH: TSH: 1.114 u[IU]/mL (ref 0.350–4.500)

## 2014-12-16 LAB — CBG MONITORING, ED
Glucose-Capillary: 133 mg/dL — ABNORMAL HIGH (ref 70–99)
Glucose-Capillary: 107 mg/dL — ABNORMAL HIGH (ref 70–99)

## 2014-12-16 LAB — MAGNESIUM: Magnesium: 2.7 mg/dL — ABNORMAL HIGH (ref 1.5–2.5)

## 2014-12-16 LAB — DIGOXIN LEVEL: Digoxin Level: 1.2 ng/mL (ref 0.8–2.0)

## 2014-12-16 LAB — BRAIN NATRIURETIC PEPTIDE

## 2014-12-16 MED ORDER — SODIUM CHLORIDE 0.9 % IJ SOLN
3.0000 mL | INTRAMUSCULAR | Status: DC | PRN
  Administered 2014-12-17 – 2014-12-21 (×2): 3 mL via INTRAVENOUS
  Filled 2014-12-16 (×2): qty 3

## 2014-12-16 MED ORDER — POTASSIUM CHLORIDE CRYS ER 20 MEQ PO TBCR
40.0000 meq | EXTENDED_RELEASE_TABLET | Freq: Once | ORAL | Status: AC
  Administered 2014-12-16: 40 meq via ORAL
  Filled 2014-12-16: qty 2

## 2014-12-16 MED ORDER — ACETAMINOPHEN 325 MG PO TABS: 650.0000 mg | ORAL_TABLET | ORAL | Status: DC | PRN

## 2014-12-16 MED ORDER — ISOSORB DINITRATE-HYDRALAZINE 20-37.5 MG PO TABS
1.0000 | ORAL_TABLET | Freq: Two times a day (BID) | ORAL | Status: DC
  Administered 2014-12-16 – 2014-12-17 (×3): 1 via ORAL
  Filled 2014-12-16 (×6): qty 1

## 2014-12-16 MED ORDER — ONDANSETRON HCL 4 MG/2ML IJ SOLN: 4.0000 mg | Freq: Four times a day (QID) | INTRAMUSCULAR | Status: DC | PRN

## 2014-12-16 MED ORDER — SODIUM CHLORIDE 0.9 % IV SOLN
250.0000 mL | INTRAVENOUS | Status: DC | PRN
  Administered 2014-12-19: 250 mL via INTRAVENOUS

## 2014-12-16 MED ORDER — SODIUM CHLORIDE 0.9 % IJ SOLN
3.0000 mL | Freq: Two times a day (BID) | INTRAMUSCULAR | Status: DC
  Administered 2014-12-16 – 2014-12-21 (×7): 3 mL via INTRAVENOUS

## 2014-12-16 MED ORDER — POTASSIUM CHLORIDE CRYS ER 20 MEQ PO TBCR
20.0000 meq | EXTENDED_RELEASE_TABLET | Freq: Every day | ORAL | Status: DC
  Administered 2014-12-16 – 2014-12-17 (×2): 20 meq via ORAL
  Filled 2014-12-16 (×3): qty 1

## 2014-12-16 MED ORDER — CARVEDILOL 3.125 MG PO TABS
3.1250 mg | ORAL_TABLET | Freq: Two times a day (BID) | ORAL | Status: DC
  Administered 2014-12-16 – 2014-12-22 (×13): 3.125 mg via ORAL
  Filled 2014-12-16 (×17): qty 1

## 2014-12-16 MED ORDER — PNEUMOCOCCAL VAC POLYVALENT 25 MCG/0.5ML IJ INJ
0.5000 mL | INJECTION | INTRAMUSCULAR | Status: AC
  Administered 2014-12-22: 0.5 mL via INTRAMUSCULAR
  Filled 2014-12-16 (×2): qty 0.5

## 2014-12-16 MED ORDER — FUROSEMIDE 10 MG/ML IJ SOLN
40.0000 mg | Freq: Two times a day (BID) | INTRAMUSCULAR | Status: DC
  Administered 2014-12-16 – 2014-12-17 (×4): 40 mg via INTRAVENOUS
  Filled 2014-12-16 (×5): qty 4

## 2014-12-16 MED ORDER — INSULIN ASPART 100 UNIT/ML ~~LOC~~ SOLN
0.0000 [IU] | Freq: Three times a day (TID) | SUBCUTANEOUS | Status: DC
  Administered 2014-12-17: 2 [IU] via SUBCUTANEOUS
  Administered 2014-12-21: 3 [IU] via SUBCUTANEOUS

## 2014-12-16 NOTE — Progress Notes (Signed)
  Echocardiogram 2D Echocardiogram has been performed.  [name], Geoffrey Demauro 12/16/2014, 11:07 AM

## 2014-12-16 NOTE — ED Notes (Signed)
CBG 133 

## 2014-12-16 NOTE — Progress Notes (Signed)
PATIENT ARRIVED TO TELE UNIT FROM E.D. VIA STRETCHER. ASSISTED TO BED. TELE APPLIED. VITALS AND WEIGHT OBTAINED.  PATIENT INSTRUCTED TO CALL FOR ASSISTANCE WHEN NEEDED.

## 2014-12-16 NOTE — Consult Note (Addendum)
Reason for Consult: Anemia and a history of alcohol abuse. Referring Physician: Dr. Charolette Forward.  Geoffrey [name] is an 69 y.o. male.     HOPI: 69 year old black male admitted with worsening shortness of breath and swelling of his legs over the last 3 weeks. Noted to be anemic on admission. He has a past medical history significant for nonischemic dilated cardiomyopathy, history of congestive heart failure secondary to poor LV function, HTN, Hyperlipidemia, Diabetes mellitus, Stage III CKD and a longstanding long history of alcohol abuse. He claims he quit in December 2015. He also has a Patient gives history of PND orthopnea. Denies any chest pain, abdominal pain, nausea, vomiting, melena or hematochezia, dysphagia or odynophagia. On admission he was noted to be hypoglycemic with blood sugar in 40's and also was noted to have hemoglobin of 8.6 gm/dl which was significant drop from 14.3 in March 2014. He gives a history of having had a colonoscopy about 10 years ago at [facility] that was reportedly normal. He cannot remember the name of the gastroenterologist who did his colonoscopy. He admits to smoking marijuana everyday. He denies IVDA.  Past Medical History  Diagnosis Date  . Hypertension   . Diabetes mellitus   . Coronary artery disease   . CHF (congestive heart failure)   . Arrythmia   . High cholesterol    Past Surgical History  Procedure Laterality Date  . Tonsillectomy  1965   History reviewed. No pertinent family history.  Social History:  reports that he quit smoking about 47 years ago. He has never used smokeless tobacco. He reports that he drinks alcohol, 2-3 beers per day. He admits to "drinking very heavy" for several years.Marland Kitchen He reports that he uses illicit drugs (Marijuana) about 3 times per week.  Allergies: No Known Allergies  Medications: I have reviewed the patient's current medications.  Results for orders placed or performed during the hospital encounter of 12/15/14  (from the past 48 hour(s))  CBG monitoring, ED     Status: Abnormal   Collection Time: 12/15/14  8:52 PM  Result Value Ref Range   Glucose-Capillary 43 (LL) 70 - 99 mg/dL  CBC with Differential     Status: Abnormal   Collection Time: 12/15/14  9:05 PM  Result Value Ref Range   WBC 6.4 4.0 - 10.5 K/uL   RBC 3.81 (L) 4.22 - 5.81 MIL/uL   Hemoglobin 8.6 (L) 13.0 - 17.0 g/dL   HCT 29.8 (L) 39.0 - 52.0 %   MCV 78.2 78.0 - 100.0 fL   MCH 22.6 (L) 26.0 - 34.0 pg   MCHC 28.9 (L) 30.0 - 36.0 g/dL   RDW 18.3 (H) 11.5 - 15.5 %   Platelets 218 150 - 400 K/uL   Neutrophils Relative % 74 43 - 77 %   Neutro Abs 4.7 1.7 - 7.7 K/uL   Lymphocytes Relative 17 12 - 46 %   Lymphs Abs 1.1 0.7 - 4.0 K/uL   Monocytes Relative 8 3 - 12 %   Monocytes Absolute 0.5 0.1 - 1.0 K/uL   Eosinophils Relative 0 0 - 5 %   Eosinophils Absolute 0.0 0.0 - 0.7 K/uL   Basophils Relative 1 0 - 1 %   Basophils Absolute 0.1 0.0 - 0.1 K/uL  Comprehensive metabolic panel     Status: Abnormal   Collection Time: 12/15/14  9:05 PM  Result Value Ref Range   Sodium 142 135 - 145 mmol/L   Potassium 3.9 3.5 - 5.1  mmol/L   Chloride 106 96 - 112 mmol/L   CO2 26 19 - 32 mmol/L   Glucose, Bld 69 (L) 70 - 99 mg/dL   BUN 27 (H) 6 - 23 mg/dL   Creatinine, Ser 2.15 (H) 0.50 - 1.35 mg/dL   Calcium 8.3 (L) 8.4 - 10.5 mg/dL   Total Protein 6.8 6.0 - 8.3 g/dL   Albumin 2.8 (L) 3.5 - 5.2 g/dL   AST 826 (H) 0 - 37 U/L   ALT 525 (H) 0 - 53 U/L   Alkaline Phosphatase 104 39 - 117 U/L   Total Bilirubin 2.1 (H) 0.3 - 1.2 mg/dL   GFR calc non Af Amer 30 (L) >90 mL/min   GFR calc Af Amer 35 (L) >90 mL/min    Comment: (NOTE) The eGFR has been calculated using the CKD EPI equation. This calculation has not been validated in all clinical situations. eGFR's persistently <90 mL/min signify possible Chronic Kidney Disease.    Anion gap 10 5 - 15  Magnesium     Status: None   Collection Time: 12/15/14  9:05 PM  Result Value Ref Range    Magnesium 2.5 1.5 - 2.5 mg/dL  POC CBG, ED     Status: Abnormal   Collection Time: 12/15/14 10:06 PM  Result Value Ref Range   Glucose-Capillary 117 (H) 70 - 99 mg/dL  POC occult blood, ED     Status: None   Collection Time: 12/15/14 10:09 PM  Result Value Ref Range   Fecal Occult Bld NEGATIVE NEGATIVE  CBC WITH DIFFERENTIAL     Status: Abnormal   Collection Time: 12/16/14 12:18 AM  Result Value Ref Range   WBC 5.3 4.0 - 10.5 K/uL   RBC 3.47 (L) 4.22 - 5.81 MIL/uL   Hemoglobin 7.8 (L) 13.0 - 17.0 g/dL   HCT 27.1 (L) 39.0 - 52.0 %   MCV 78.1 78.0 - 100.0 fL   MCH 22.5 (L) 26.0 - 34.0 pg   MCHC 28.8 (L) 30.0 - 36.0 g/dL   RDW 18.3 (H) 11.5 - 15.5 %   Platelets 214 150 - 400 K/uL   Neutrophils Relative % 71 43 - 77 %   Neutro Abs 3.8 1.7 - 7.7 K/uL   Lymphocytes Relative 19 12 - 46 %   Lymphs Abs 1.0 0.7 - 4.0 K/uL   Monocytes Relative 9 3 - 12 %   Monocytes Absolute 0.5 0.1 - 1.0 K/uL   Eosinophils Relative 0 0 - 5 %   Eosinophils Absolute 0.0 0.0 - 0.7 K/uL   Basophils Relative 1 0 - 1 %   Basophils Absolute 0.1 0.0 - 0.1 K/uL  Comprehensive metabolic panel     Status: Abnormal   Collection Time: 12/16/14 12:18 AM  Result Value Ref Range   Sodium 142 135 - 145 mmol/L   Potassium 3.5 3.5 - 5.1 mmol/L   Chloride 104 96 - 112 mmol/L   CO2 27 19 - 32 mmol/L   Glucose, Bld 143 (H) 70 - 99 mg/dL   BUN 28 (H) 6 - 23 mg/dL   Creatinine, Ser 2.13 (H) 0.50 - 1.35 mg/dL   Calcium 8.3 (L) 8.4 - 10.5 mg/dL   Total Protein 5.8 (L) 6.0 - 8.3 g/dL   Albumin 2.6 (L) 3.5 - 5.2 g/dL   AST 728 (H) 0 - 37 U/L   ALT 501 (H) 0 - 53 U/L   Alkaline Phosphatase 100 39 - 117 U/L   Total Bilirubin 1.7 (H)  0.3 - 1.2 mg/dL   GFR calc non Af Amer 31 (L) >90 mL/min   GFR calc Af Amer 35 (L) >90 mL/min    Comment: (NOTE) The eGFR has been calculated using the CKD EPI equation. This calculation has not been validated in all clinical situations. eGFR's persistently <90 mL/min signify possible  Chronic Kidney Disease.    Anion gap 11 5 - 15  Protime-INR     Status: Abnormal   Collection Time: 12/16/14 12:18 AM  Result Value Ref Range   Prothrombin Time 20.8 (H) 11.6 - 15.2 seconds   INR 1.77 (H) 0.00 - 1.49  Magnesium     Status: Abnormal   Collection Time: 12/16/14 12:18 AM  Result Value Ref Range   Magnesium 2.7 (H) 1.5 - 2.5 mg/dL  Brain natriuretic peptide     Status: Abnormal   Collection Time: 12/16/14 12:18 AM  Result Value Ref Range   B Natriuretic Peptide >4500.0 (H) 0.0 - 100.0 pg/mL  Digoxin level     Status: None   Collection Time: 12/16/14 12:18 AM  Result Value Ref Range   Digoxin Level 1.2 0.8 - 2.0 ng/mL  TSH     Status: None   Collection Time: 12/16/14 12:18 AM  Result Value Ref Range   TSH 1.114 0.350 - 4.500 uIU/mL  CBG monitoring, ED     Status: Abnormal   Collection Time: 12/16/14  1:09 AM  Result Value Ref Range   Glucose-Capillary 133 (H) 70 - 99 mg/dL  Basic metabolic panel     Status: Abnormal   Collection Time: 12/16/14  4:50 AM  Result Value Ref Range   Sodium 141 135 - 145 mmol/L   Potassium 3.3 (L) 3.5 - 5.1 mmol/L   Chloride 105 96 - 112 mmol/L   CO2 26 19 - 32 mmol/L   Glucose, Bld 84 70 - 99 mg/dL   BUN 26 (H) 6 - 23 mg/dL   Creatinine, Ser 2.00 (H) 0.50 - 1.35 mg/dL   Calcium 8.2 (L) 8.4 - 10.5 mg/dL   GFR calc non Af Amer 33 (L) >90 mL/min   GFR calc Af Amer 38 (L) >90 mL/min    Comment: (NOTE) The eGFR has been calculated using the CKD EPI equation. This calculation has not been validated in all clinical situations. eGFR's persistently <90 mL/min signify possible Chronic Kidney Disease.    Anion gap 10 5 - 15  CBC     Status: Abnormal   Collection Time: 12/16/14  4:50 AM  Result Value Ref Range   WBC 5.6 4.0 - 10.5 K/uL   RBC 3.55 (L) 4.22 - 5.81 MIL/uL   Hemoglobin 8.0 (L) 13.0 - 17.0 g/dL   HCT 27.8 (L) 39.0 - 52.0 %   MCV 78.3 78.0 - 100.0 fL   MCH 22.5 (L) 26.0 - 34.0 pg   MCHC 28.8 (L) 30.0 - 36.0 g/dL   RDW  18.2 (H) 11.5 - 15.5 %   Platelets 200 150 - 400 K/uL  CBG monitoring, ED     Status: Abnormal   Collection Time: 12/16/14  5:22 AM  Result Value Ref Range   Glucose-Capillary 107 (H) 70 - 99 mg/dL  Glucose, capillary     Status: None   Collection Time: 12/16/14  7:30 AM  Result Value Ref Range   Glucose-Capillary 95 70 - 99 mg/dL  Glucose, capillary     Status: Abnormal   Collection Time: 12/16/14 11:27 AM  Result Value Ref Range  Glucose-Capillary 160 (H) 70 - 99 mg/dL   Comment 1 Notify RN    Comment 2 Documented in Char   Glucose, capillary     Status: Abnormal   Collection Time: 12/16/14  4:40 PM  Result Value Ref Range   Glucose-Capillary 116 (H) 70 - 99 mg/dL   Comment 1 Notify RN    Comment 2 Documented in Char    US Abdomen Complete  12/16/2014   CLINICAL DATA:  Acute on chronic renal insufficiency. Abnormal liver function tests.  EXAM: ULTRASOUND ABDOMEN COMPLETE  COMPARISON:  None.  FINDINGS: Gallbladder: The gallbladder is partially contracted and there is mild gallbladder mural thickening. However, the patient was not fasting for this examination and this appearance could be physiologic. No calculi are evident. The patient was not tender over the gallbladder.  Common bile duct: Diameter: 4.1 mm  Liver: There is irregular coarsening of hepatic echotexture without discrete mass. There is no intrahepatic bile duct dilatation. There is perihepatic ascites.  IVC: No abnormality visualized.  Pancreas: Limited views of the pancreatic head are grossly unremarkable. The body and tail were not visible.  Spleen: Size and appearance within normal limits.  Right Kidney: Length: 11.9 cm. Mildly echogenic consistent with medical renal disease. No hydronephrosis.  Left Kidney: Length: 12.1 cm. Mildly increased echogenicity consistent with medical renal disease. No hydronephrosis.  Abdominal aorta: No aneurysm visualized.  Other findings: Perihepatic ascites  IMPRESSION: Irregular coarsening of  hepatic echotexture, consistent with cirrhosis or fatty infiltration. Mild-to-moderate perihepatic ascites. Nonspecific gallbladder wall thickening and partial gallbladder contraction, possibly physiologic due to nonfasting state. No bile duct dilatation.  Negative for hydronephrosis. Mildly echogenic kidneys consistent with medical renal disease.  Nondiagnostic views of the pancreatic body and tail.   Electronically Signed   By: Andreas Newport M.D.   On: 12/16/2014 02:55   Review of Systems  Constitutional: Negative for fever, weight loss, malaise/fatigue and diaphoresis.  HENT: Negative.   Eyes: Negative.   Respiratory: Positive for shortness of breath. Negative for cough, hemoptysis, sputum production and wheezing.   Cardiovascular: Positive for leg swelling. Negative for chest pain, palpitations and orthopnea.  Gastrointestinal: Negative.   Genitourinary: Negative.   Musculoskeletal: Negative.   Skin: Negative.   Neurological: Negative.  Negative for weakness.  Psychiatric/Behavioral: Negative.    Blood pressure 117/93, pulse 87, temperature 98 F (36.7 C), temperature source Oral, resp. rate 18, height 5' 6"  (1.676 m), weight 74.2 kg (163 lb 9.3 oz), SpO2 100 %. Physical Exam  Constitutional: He is oriented to person, place, and time. He appears well-developed and well-nourished.  HENT:  Head: Normocephalic and atraumatic.  Eyes: Conjunctivae and EOM are normal. Pupils are equal, round, and reactive to light.  Neck: Normal range of motion. Neck supple.  Cardiovascular: Normal rate and regular rhythm.   Respiratory: Effort normal and breath sounds normal.  GI: Soft. Bowel sounds are normal.  Neurological: He is alert and oriented to person, place, and time.  Skin: Skin is warm.  Psychiatric: He has a normal mood and affect. His behavior is normal. Judgment and thought content normal.   Assessment/Plan: 1) Severe anemia with guaiac negative stools on FOBT: He will need a work up  for his anemia once he is cleared by Dr. Terrence Dupont from a cardiac standpoint. If the EGD/Colonoscopy are unrevealing a hematology workup will be needed.   2) Abnormal LFT's with hyperbilirubunemia; coagulopathy; AST/ALT 728/501-cirrhosis vs fatty liver on abdominal ultrasound-probably cirrhosis.Marland Kitchen 3) Acute decompensated systolic heart failure; BNP >  4500 4) Severe nonischemic dilated cardiomyopathy 5) Hypertension 6) Diabetes mellitus 7) Acute on chronic kidney injury 8) History of tobacco abuse  Geoffrey [name] 12/16/2014, 4:53 PM

## 2014-12-16 NOTE — Progress Notes (Signed)
Subjective:  Denies any chest pain states breathing is improved.  Abdominal and leg swelling persist.  Abdominal or tremors sound suggested cirrhosis of liver with ascites.  Patient states he has been drinking until December last year.  Objective:  Vital Signs in the last 24 hours: Temp:  [97.8 F (36.6 C)-98.5 F (36.9 C)] 98 F (36.7 C) (03/16 1610) Pulse Rate:  [44-107] 87 (03/16 0638) Resp:  [17-28] 18 (03/16 0638) BP: (110-141)/(66-96) 117/93 mmHg (03/16 0638) SpO2:  [92 %-100 %] 100 % (03/16 0638) Weight:  [74.2 kg (163 lb 9.3 oz)-74.39 kg (164 lb)] 74.2 kg (163 lb 9.3 oz) (03/16 9604)  Intake/Output from previous day: 03/15 0701 - 03/16 0700 In: -  Out: 200 [Urine:200] Intake/Output from this shift: Total I/O In: 240 [P.O.:240] Out: 400 [Urine:400]  Physical Exam: Neck: no adenopathy, no carotid bruit, no JVD and supple, symmetrical, trachea midline Lungs: decreased breath sounds at bases Heart: regular rate and rhythm, S1, S2 normal and soft systolic murmur and S3 gallop noted Abdomen: soft distended nontender Extremities: no clubbing cyanosis 4+ edema noted  Lab Results:  Recent Labs  12/16/14 0018 12/16/14 0450  WBC 5.3 5.6  HGB 7.8* 8.0*  PLT 214 200    Recent Labs  12/16/14 0018 12/16/14 0450  NA 142 141  K 3.5 3.3*  CL 104 105  CO2 27 26  GLUCOSE 143* 84  BUN 28* 26*  CREATININE 2.13* 2.00*   No results for input(s): TROPONINI in the last 72 hours.  Invalid input(s): CK, MB Hepatic Function Panel  Recent Labs  12/16/14 0018  PROT 5.8*  ALBUMIN 2.6*  AST 728*  ALT 501*  ALKPHOS 100  BILITOT 1.7*   No results for input(s): CHOL in the last 72 hours. No results for input(s): PROTIME in the last 72 hours.  Imaging: Imaging results have been reviewed and US Abdomen Complete  12/16/2014   CLINICAL DATA:  Acute on chronic renal insufficiency. Abnormal liver function tests.  EXAM: ULTRASOUND ABDOMEN COMPLETE  COMPARISON:  None.  FINDINGS:  Gallbladder: The gallbladder is partially contracted and there is mild gallbladder mural thickening. However, the patient was not fasting for this examination and this appearance could be physiologic. No calculi are evident. The patient was not tender over the gallbladder.  Common bile duct: Diameter: 4.1 mm  Liver: There is irregular coarsening of hepatic echotexture without discrete mass. There is no intrahepatic bile duct dilatation. There is perihepatic ascites.  IVC: No abnormality visualized.  Pancreas: Limited views of the pancreatic head are grossly unremarkable. The body and tail were not visible.  Spleen: Size and appearance within normal limits.  Right Kidney: Length: 11.9 cm. Mildly echogenic consistent with medical renal disease. No hydronephrosis.  Left Kidney: Length: 12.1 cm. Mildly increased echogenicity consistent with medical renal disease. No hydronephrosis.  Abdominal aorta: No aneurysm visualized.  Other findings: Perihepatic ascites  IMPRESSION: Irregular coarsening of hepatic echotexture, consistent with cirrhosis or fatty infiltration. Mild-to-moderate perihepatic ascites. Nonspecific gallbladder wall thickening and partial gallbladder contraction, possibly physiologic due to nonfasting state. No bile duct dilatation.  Negative for hydronephrosis. Mildly echogenic kidneys consistent with medical renal disease.  Nondiagnostic views of the pancreatic body and tail.   Electronically Signed   By: Ellery Plunk M.D.   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  LOS: 1 day    Roya Gieselman N 12/16/2014, 5:02 PM

## 2014-12-16 NOTE — ED Notes (Signed)
Patient transported to Ultrasound 

## 2014-12-17 LAB — BASIC METABOLIC PANEL
Anion gap: 14 (ref 5–15)
BUN: 29 mg/dL — ABNORMAL HIGH (ref 6–23)
CO2: 18 mmol/L — AB (ref 19–32)
Calcium: 8 mg/dL — ABNORMAL LOW (ref 8.4–10.5)
Chloride: 107 mmol/L (ref 96–112)
Creatinine, Ser: 2 mg/dL — ABNORMAL HIGH (ref 0.50–1.35)
GFR calc Af Amer: 38 mL/min — ABNORMAL LOW (ref >90)
GFR, EST NON AFRICAN AMERICAN: 33 mL/min — AB (ref >90)
Glucose, Bld: 100 mg/dL — ABNORMAL HIGH (ref 70–99)
POTASSIUM: 4.1 mmol/L (ref 3.5–5.1)
Sodium: 139 mmol/L (ref 135–145)

## 2014-12-17 LAB — GLUCOSE, CAPILLARY
GLUCOSE-CAPILLARY: 109 mg/dL — AB (ref 70–99)
Glucose-Capillary: 179 mg/dL — ABNORMAL HIGH (ref 70–99)
Glucose-Capillary: 111 mg/dL — ABNORMAL HIGH (ref 70–99)
Glucose-Capillary: 134 mg/dL — ABNORMAL HIGH (ref 70–99)

## 2014-12-17 LAB — CBC
HCT: 28 % — ABNORMAL LOW (ref 39.0–52.0)
Hemoglobin: 7.9 g/dL — ABNORMAL LOW (ref 13.0–17.0)
MCH: 22.4 pg — ABNORMAL LOW (ref 26.0–34.0)
MCHC: 28.2 g/dL — ABNORMAL LOW (ref 30.0–36.0)
MCV: 79.3 fL (ref 78.0–100.0)
Platelets: 178 10*3/uL (ref 150–400)
RBC: 3.53 MIL/uL — AB (ref 4.22–5.81)
RDW: 18.9 % — AB (ref 11.5–15.5)
WBC: 5.9 10*3/uL (ref 4.0–10.5)

## 2014-12-17 LAB — MAGNESIUM: Magnesium: 2.4 mg/dL (ref 1.5–2.5)

## 2014-12-17 LAB — HEMOGLOBIN A1C
Hgb A1c MFr Bld: 5.9 % — ABNORMAL HIGH (ref 4.8–5.6)
Mean Plasma Glucose: 123 mg/dL

## 2014-12-17 LAB — BRAIN NATRIURETIC PEPTIDE: B NATRIURETIC PEPTIDE 5: 2375.5 pg/mL — AB (ref 0.0–100.0)

## 2014-12-17 MED ORDER — FUROSEMIDE 10 MG/ML IJ SOLN
40.0000 mg | Freq: Two times a day (BID) | INTRAMUSCULAR | Status: DC
  Administered 2014-12-17 – 2014-12-18 (×2): 40 mg via INTRAVENOUS
  Filled 2014-12-17 (×3): qty 4

## 2014-12-17 MED ORDER — ISOSORB DINITRATE-HYDRALAZINE 20-37.5 MG PO TABS
0.5000 | ORAL_TABLET | Freq: Two times a day (BID) | ORAL | Status: DC
  Administered 2014-12-17 – 2014-12-22 (×10): 0.5 via ORAL
  Filled 2014-12-17 (×12): qty 0.5

## 2014-12-17 MED ORDER — FERROUS SULFATE 325 (65 FE) MG PO TABS
325.0000 mg | ORAL_TABLET | Freq: Three times a day (TID) | ORAL | Status: DC
  Administered 2014-12-17 – 2014-12-22 (×15): 325 mg via ORAL
  Filled 2014-12-17 (×17): qty 1

## 2014-12-17 MED ORDER — MILRINONE IN DEXTROSE 20 MG/100ML IV SOLN
0.3750 ug/kg/min | INTRAVENOUS | Status: DC
  Administered 2014-12-17 – 2014-12-18 (×3): 0.25 ug/kg/min via INTRAVENOUS
  Administered 2014-12-19: 0.5 ug/kg/min via INTRAVENOUS
  Administered 2014-12-19: 0.25 ug/kg/min via INTRAVENOUS
  Administered 2014-12-19: 0.5 ug/kg/min via INTRAVENOUS
  Administered 2014-12-19: 0.25 ug/kg/min via INTRAVENOUS
  Administered 2014-12-20: 0.5 ug/kg/min via INTRAVENOUS
  Administered 2014-12-20 – 2014-12-22 (×3): 0.375 ug/kg/min via INTRAVENOUS
  Filled 2014-12-17 (×13): qty 100

## 2014-12-17 NOTE — Progress Notes (Signed)
Subjective:  Patient denies any chest pain states breathing is slightly improved but continues to have abdominal and leg swelling To react, showed moderately depressed LV systolic function as compared to prior echoes with EF approximately 10-15% at the most. Objective:  Vital Signs in the last 24 hours: Temp:  [97.8 F (36.6 C)-98 F (36.7 C)] 98 F (36.7 C) (03/17 1335) Pulse Rate:  [74-90] 76 (03/17 1436) Resp:  [16-20] 20 (03/17 1436) BP: (101-124)/(60-83) 108/60 mmHg (03/17 1436) SpO2:  [94 %-100 %] 100 % (03/17 1436) Weight:  [75.1 kg (165 lb 9.1 oz)] 75.1 kg (165 lb 9.1 oz) (03/17 0155)  Intake/Output from previous day: 03/16 0701 - 03/17 0700 In: 720 [P.O.:720] Out: 1801 [Urine:1800; Stool:1] Intake/Output from this shift: Total I/O In: 360 [P.O.:360] Out: 100 [Urine:100]  Physical Exam: Neck: JVD - 10 cm above sternal notch, no adenopathy, no carotid bruit and supple, symmetrical, trachea midline Lungs: decreased breath sounds at bases with bibasilar rales Heart: regular rate and rhythm, S1, S2 normal and soft systolic and diastolic murmur noted Abdomen: soft, non-tender; bowel sounds normal; no masses,  no organomegaly Extremities: no clubbing cyanosis 4+ edema noted  Lab Results:  Recent Labs  12/16/14 0450 12/17/14 0459  WBC 5.6 5.9  HGB 8.0* 7.9*  PLT 200 178    Recent Labs  12/16/14 0450 12/17/14 0459  NA 141 139  K 3.3* 4.1  CL 105 107  CO2 26 18*  GLUCOSE 84 100*  BUN 26* 29*  CREATININE 2.00* 2.00*   No results for input(s): TROPONINI in the last 72 hours.  Invalid input(s): CK, MB Hepatic Function Panel  Recent Labs  12/16/14 0018  PROT 5.8*  ALBUMIN 2.6*  AST 728*  ALT 501*  ALKPHOS 100  BILITOT 1.7*   No results for input(s): CHOL in the last 72 hours. No results for input(s): PROTIME in the last 72 hours.  Imaging: Imaging results have been reviewed and US Abdomen Complete  12/16/2014   CLINICAL DATA:  Acute on chronic  renal insufficiency. Abnormal liver function tests.  EXAM: ULTRASOUND ABDOMEN COMPLETE  COMPARISON:  None.  FINDINGS: Gallbladder: The gallbladder is partially contracted and there is mild gallbladder mural thickening. However, the patient was not fasting for this examination and this appearance could be physiologic. No calculi are evident. The patient was not tender over the gallbladder.  Common bile duct: Diameter: 4.1 mm  Liver: There is irregular coarsening of hepatic echotexture without discrete mass. There is no intrahepatic bile duct dilatation. There is perihepatic ascites.  IVC: No abnormality visualized.  Pancreas: Limited views of the pancreatic head are grossly unremarkable. The body and tail were not visible.  Spleen: Size and appearance within normal limits.  Right Kidney: Length: 11.9 cm. Mildly echogenic consistent with medical renal disease. No hydronephrosis.  Left Kidney: Length: 12.1 cm. Mildly increased echogenicity consistent with medical renal disease. No hydronephrosis.  Abdominal aorta: No aneurysm visualized.  Other findings: Perihepatic ascites  IMPRESSION: Irregular coarsening of hepatic echotexture, consistent with cirrhosis or fatty infiltration. Mild-to-moderate perihepatic ascites. Nonspecific gallbladder wall thickening and partial gallbladder contraction, possibly physiologic due to nonfasting state. No bile duct dilatation.  Negative for hydronephrosis. Mildly echogenic kidneys consistent with medical renal disease.  Nondiagnostic views of the pancreatic body and tail.   Electronically Signed   By: Ellery Plunk M.D.   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  LOS: 2 days    Sherre Wooton N 12/17/2014, 5:25 PM

## 2014-12-17 NOTE — Progress Notes (Signed)
Subjective: SOB is better, but still fluid overloaded.  Objective: Vital signs in last 24 hours: Temp:  [97.8 F (36.6 C)-98 F (36.7 C)] 97.8 F (36.6 C) (03/17 0424) Pulse Rate:  [74-90] 82 (03/17 1123) Resp:  [16-20] 16 (03/17 0424) BP: (101-124)/(63-83) 115/74 mmHg (03/17 1123) SpO2:  [94 %-100 %] 100 % (03/17 0424) Weight:  [75.1 kg (165 lb 9.1 oz)] 75.1 kg (165 lb 9.1 oz) (03/17 0155) Last BM Date: 12/16/14  Intake/Output from previous day: 03/16 0701 - 03/17 0700 In: 360 [P.O.:360] Out: 1350 [Urine:1350] Intake/Output this shift: Total I/O In: 360 [P.O.:360] Out: 100 [Urine:100]  General appearance: alert and no distress GI: soft, non-tender; bowel sounds normal; no masses,  no organomegaly  Lab Results:  Recent Labs  12/16/14 0018 12/16/14 0450 12/17/14 0459  WBC 5.3 5.6 5.9  HGB 7.8* 8.0* 7.9*  HCT 27.1* 27.8* 28.0*  PLT 214 200 178   BMET  Recent Labs  12/16/14 0018 12/16/14 0450 12/17/14 0459  NA 142 141 139  K 3.5 3.3* 4.1  CL 104 105 107  CO2 27 26 18*  GLUCOSE 143* 84 100*  BUN 28* 26* 29*  CREATININE 2.13* 2.00* 2.00*  CALCIUM 8.3* 8.2* 8.0*   LFT  Recent Labs  12/16/14 0018  PROT 5.8*  ALBUMIN 2.6*  AST 728*  ALT 501*  ALKPHOS 100  BILITOT 1.7*   PT/INR  Recent Labs  12/16/14 0018  LABPROT 20.8*  INR 1.77*   Hepatitis Panel No results for input(s): HEPBSAG, HCVAB, HEPAIGM, HEPBIGM in the last 72 hours. C-Diff No results for input(s): CDIFFTOX in the last 72 hours. Fecal Lactopherrin No results for input(s): FECLLACTOFRN in the last 72 hours.  Studies/Results: US Abdomen Complete  12/16/2014   CLINICAL DATA:  Acute on chronic renal insufficiency. Abnormal liver function tests.  EXAM: ULTRASOUND ABDOMEN COMPLETE  COMPARISON:  None.  FINDINGS: Gallbladder: The gallbladder is partially contracted and there is mild gallbladder mural thickening. However, the patient was not fasting for this examination and this appearance  could be physiologic. No calculi are evident. The patient was not tender over the gallbladder.  Common bile duct: Diameter: 4.1 mm  Liver: There is irregular coarsening of hepatic echotexture without discrete mass. There is no intrahepatic bile duct dilatation. There is perihepatic ascites.  IVC: No abnormality visualized.  Pancreas: Limited views of the pancreatic head are grossly unremarkable. The body and tail were not visible.  Spleen: Size and appearance within normal limits.  Right Kidney: Length: 11.9 cm. Mildly echogenic consistent with medical renal disease. No hydronephrosis.  Left Kidney: Length: 12.1 cm. Mildly increased echogenicity consistent with medical renal disease. No hydronephrosis.  Abdominal aorta: No aneurysm visualized.  Other findings: Perihepatic ascites  IMPRESSION: Irregular coarsening of hepatic echotexture, consistent with cirrhosis or fatty infiltration. Mild-to-moderate perihepatic ascites. Nonspecific gallbladder wall thickening and partial gallbladder contraction, possibly physiologic due to nonfasting state. No bile duct dilatation.  Negative for hydronephrosis. Mildly echogenic kidneys consistent with medical renal disease.  Nondiagnostic views of the pancreatic body and tail.   Electronically Signed   By: Ellery Plunk M.D.   On: 12/16/2014 02:55    Medications:  Scheduled: . carvedilol  3.125 mg Oral BID WC  . ferrous sulfate  325 mg Oral TID WC  . furosemide  40 mg Intravenous BID  . insulin aspart  0-9 Units Subcutaneous TID WC  . isosorbide-hydrALAZINE  0.5 tablet Oral BID  . pneumococcal 23 valent vaccine  0.5 mL Intramuscular Tomorrow-1000  .  potassium chloride  20 mEq Oral Daily  . 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 3) Severe CHF with EF of 10%. 4) Anemia. 5) Ascites.   I was able to confer with Dr. Sharyn LullHarwani, the patient, and his wife in the room.  At  this time his cardiac status is much too poor to pursue any type of endoscopic work up for the anemia.  In fact, I do not feel that he will ever get to a point where these procedures can be safely performed.  As for his elevated liver enzymes the physical examination did reveal a significant hepatojugular reflux in the upright position.  It would obviously be much more prominent if he were in the semi reclined position.  There is a component of congestive hepatopathy, but usually it does not result in such high AST/ALT.  He has an underlying cirrhosis secondary to ETOH.  Maybe there is a component of a viral etiology, but at this time point, the utility is very low to pursue further work up.  Plan: 1) No further GI evaluation at this time. 2) Continue with cardiac treatment per Dr. Sharyn LullHarwani.  LOS: 2 days   Geoffrey [name] D 12/17/2014, 12:19 PM

## 2014-12-17 NOTE — Progress Notes (Signed)
Utilization review completed. Baden Betsch, RN, BSN. 

## 2014-12-17 NOTE — Progress Notes (Signed)
MEDICATION RELATED CONSULT NOTE - INITIAL   Pharmacy Consult for Milrinone  Indication: Acute inotropic support   No Known Allergies  Patient Measurements: Height: 5\' 6"  (167.6 cm) Weight: 165 lb 9.1 oz (75.1 kg) IBW/kg (Calculated) : 63.8  Vital Signs: Temp: 98 F (36.7 C) (03/17 1335) Temp Source: Oral (03/17 1335) BP: 111/67 mmHg (03/17 1335) Pulse Rate: 77 (03/17 1335) Intake/Output from previous day: 03/16 0701 - 03/17 0700 In: 720 [P.O.:720] Out: 1801 [Urine:1800; Stool:1] Intake/Output from this shift: Total I/O In: 360 [P.O.:360] Out: 100 [Urine:100]  Labs:  Recent Labs  12/15/14 2105 12/16/14 0018 12/16/14 0450 12/17/14 0459  WBC 6.4 5.3 5.6 5.9  HGB 8.6* 7.8* 8.0* 7.9*  HCT 29.8* 27.1* 27.8* 28.0*  PLT 218 214 200 178  CREATININE 2.15* 2.13* 2.00* 2.00*  MG 2.5 2.7*  --  2.4  ALBUMIN 2.8* 2.6*  --   --   PROT 6.8 5.8*  --   --   AST 826* 728*  --   --   ALT 525* 501*  --   --   ALKPHOS 104 100  --   --   BILITOT 2.1* 1.7*  --   --    Estimated Creatinine Clearance: 32.8 mL/min (by C-G formula based on Cr of 2).   Microbiology: No results found for this or any previous visit (from the past 720 hour(s)).  Medical History: Past Medical History  Diagnosis Date  . Hypertension   . Diabetes mellitus   . Coronary artery disease   . CHF (congestive heart failure)   . Irregular heartbeat   . High cholesterol   . Shortness of breath dyspnea     Medications:  Prescriptions prior to admission  Medication Sig Dispense Refill Last Dose  . aspirin 81 MG tablet Take 81 mg by mouth daily.     12/14/2014 at Unknown time  . carvedilol (COREG CR) 80 MG 24 hr capsule Take 80 mg by mouth daily.     12/14/2014 at 8a  . digoxin (LANOXIN) 0.25 MG tablet Take 250 mcg by mouth daily.     12/14/2014 at Unknown time  . furosemide (LASIX) 80 MG tablet Take 80 mg by mouth 2 (two) times daily.     12/14/2014 at Unknown time  . glipiZIDE (GLUCOTROL) 10 MG tablet Take 10  mg by mouth 2 (two) times daily before a meal.     12/14/2014 at Unknown time  . metFORMIN (GLUCOPHAGE) 500 MG tablet Take 500 mg by mouth 2 (two) times daily with a meal.     12/14/2014 at Unknown time  . potassium chloride SA (K-DUR,KLOR-CON) 20 MEQ tablet Take 20 mEq by mouth 2 (two) times daily.     12/14/2014 at Unknown time  . ramipril (ALTACE) 10 MG tablet Take 10 mg by mouth daily.     12/14/2014 at Unknown time  . rosuvastatin (CRESTOR) 20 MG tablet Take 20 mg by mouth daily.     12/14/2014 at Unknown time    Assessment: [age] YOM with SOB and acute decompensated HF with EF of 10%. Per GI, his cardiac status is too poor for endoscopic work up. Pharmacy consulted to start milrinone drip. CrCl ~ 32 mL/min.   Goal of Therapy:  Per MD   Plan:  Start milrinone at 0.25 mcg/kg/min. May titrate based on MD instructions Pharmacy will sign off but will follow peripherally for renal dose adjustments.   Vinnie LevelBenjamin Darnelle Corp, PharmD., BCPS Clinical Pharmacist Phone [phone]

## 2014-12-17 NOTE — Progress Notes (Signed)
Pt with 3 runs SVT and one 10 beat run of vtach this shift. Pt states he felt a flutter with the run of vtach. Previous RN notified MD of these events and placed new orders for BMET and Mg to be drawn. Pt VSS overnight with no complaints of shortness of breath or chest pain. Will continue to monitor. Huel Coventryosenberger, Angie Piercey A, RN

## 2014-12-17 NOTE — Progress Notes (Signed)
Pt asymptomatic, no SOB, had 31 beat run of superventricular tachycardia and then 11 beat run of ventricular tachycardia, MD made aware, MD ordered basic metabolic panel and magnesium labs to be drawn, RN will continue to monitor, pt sitting in chair, call bell within reach.   Marcia BrashBrea Johnson, [place]RN 12/17/2014

## 2014-12-18 LAB — CBC
HCT: 26 % — ABNORMAL LOW (ref 39.0–52.0)
Hemoglobin: 7.6 g/dL — ABNORMAL LOW (ref 13.0–17.0)
MCH: 22.4 pg — ABNORMAL LOW (ref 26.0–34.0)
MCHC: 29.2 g/dL — ABNORMAL LOW (ref 30.0–36.0)
MCV: 76.7 fL — AB (ref 78.0–100.0)
PLATELETS: 195 10*3/uL (ref 150–400)
RBC: 3.39 MIL/uL — AB (ref 4.22–5.81)
RDW: 18.6 % — ABNORMAL HIGH (ref 11.5–15.5)
WBC: 5.3 10*3/uL (ref 4.0–10.5)

## 2014-12-18 LAB — BASIC METABOLIC PANEL
Anion gap: 7 (ref 5–15)
BUN: 26 mg/dL — AB (ref 6–23)
CO2: 27 mmol/L (ref 19–32)
Calcium: 7.8 mg/dL — ABNORMAL LOW (ref 8.4–10.5)
Chloride: 106 mmol/L (ref 96–112)
Creatinine, Ser: 1.9 mg/dL — ABNORMAL HIGH (ref 0.50–1.35)
GFR calc non Af Amer: 35 mL/min — ABNORMAL LOW (ref >90)
GFR, EST AFRICAN AMERICAN: 41 mL/min — AB (ref >90)
Glucose, Bld: 188 mg/dL — ABNORMAL HIGH (ref 70–99)
POTASSIUM: 3.2 mmol/L — AB (ref 3.5–5.1)
SODIUM: 140 mmol/L (ref 135–145)

## 2014-12-18 LAB — GLUCOSE, CAPILLARY
GLUCOSE-CAPILLARY: 154 mg/dL — AB (ref 70–99)
Glucose-Capillary: 126 mg/dL — ABNORMAL HIGH (ref 70–99)
Glucose-Capillary: 144 mg/dL — ABNORMAL HIGH (ref 70–99)
Glucose-Capillary: 154 mg/dL — ABNORMAL HIGH (ref 70–99)

## 2014-12-18 LAB — BRAIN NATRIURETIC PEPTIDE: B NATRIURETIC PEPTIDE 5: 1897.7 pg/mL — AB (ref 0.0–100.0)

## 2014-12-18 MED ORDER — POTASSIUM CHLORIDE CRYS ER 20 MEQ PO TBCR
40.0000 meq | EXTENDED_RELEASE_TABLET | Freq: Once | ORAL | Status: AC
  Administered 2014-12-18: 40 meq via ORAL
  Filled 2014-12-18: qty 2

## 2014-12-18 MED ORDER — FUROSEMIDE 10 MG/ML IJ SOLN
80.0000 mg | Freq: Two times a day (BID) | INTRAMUSCULAR | Status: DC
  Administered 2014-12-18 – 2014-12-21 (×6): 80 mg via INTRAVENOUS
  Filled 2014-12-18 (×9): qty 8

## 2014-12-18 MED ORDER — POTASSIUM CHLORIDE CRYS ER 20 MEQ PO TBCR
30.0000 meq | EXTENDED_RELEASE_TABLET | Freq: Two times a day (BID) | ORAL | Status: DC
  Administered 2014-12-18 – 2014-12-21 (×6): 30 meq via ORAL
  Filled 2014-12-18 (×8): qty 1

## 2014-12-18 MED ORDER — POTASSIUM CHLORIDE CRYS ER 20 MEQ PO TBCR: 20.0000 meq | EXTENDED_RELEASE_TABLET | Freq: Two times a day (BID) | ORAL | Status: DC

## 2014-12-18 NOTE — Progress Notes (Signed)
Subjective:  Denies any chest pain or palpitations states breathing is slowly improving.abdominal and leg swelling persist.  Diuresing better after starting milrinone   Objective:  Vital Signs in the last 24 hours: Temp:  [98.3 F (36.8 C)] 98.3 F (36.8 C) (03/18 0608) Pulse Rate:  [76-89] 89 (03/18 0608) Resp:  [18-20] 18 (03/18 0608) BP: (108-124)/(60-84) 124/84 mmHg (03/18 0608) SpO2:  [95 %-100 %] 95 % (03/18 0608) Weight:  [75.2 kg (165 lb 12.6 oz)] 75.2 kg (165 lb 12.6 oz) (03/18 40980608)  Intake/Output from previous day: 03/17 0701 - 03/18 0700 In: 1320 [P.O.:1320] Out: 2650 [Urine:2650] Intake/Output from this shift: Total I/O In: 480 [P.O.:480] Out: 1050 [Urine:1050]  Physical Exam: Neck: JVD - 8 cm above sternal notch, no adenopathy, no carotid bruit and supple, symmetrical, trachea midline Lungs: decreased breath sounds at bases with bibasilar rales Heart: regular rate and rhythm, S1, S2 normal and soft systolic and diastolic murmur noted Abdomen: soft, non-tender; bowel sounds normal; no masses,  no organomegaly Extremities: no clubbing cyanosis 4+ edema persist  Lab Results:  Recent Labs  12/17/14 0459 12/18/14 0525  WBC 5.9 5.3  HGB 7.9* 7.6*  PLT 178 195    Recent Labs  12/17/14 0459 12/18/14 0525  NA 139 140  K 4.1 3.2*  CL 107 106  CO2 18* 27  GLUCOSE 100* 188*  BUN 29* 26*  CREATININE 2.00* 1.90*   No results for input(s): TROPONINI in the last 72 hours.  Invalid input(s): CK, MB Hepatic Function Panel  Recent Labs  12/16/14 0018  PROT 5.8*  ALBUMIN 2.6*  AST 728*  ALT 501*  ALKPHOS 100  BILITOT 1.7*   No results for input(s): CHOL in the last 72 hours. No results for input(s): PROTIME in the last 72 hours.  Imaging: Imaging results have been reviewed and No results found.  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  LOS: 3 days    Geoffrey [name] N 12/18/2014, 1:39 PM

## 2014-12-18 NOTE — Progress Notes (Signed)
Pt had 9 beat run of nonsustained VT. Pt resting with call bell within reach.  Will continue to monitor. Thomas HoffBurton, Gianmarco Roye McClintock

## 2014-12-19 LAB — BASIC METABOLIC PANEL
Anion gap: 6 (ref 5–15)
BUN: 21 mg/dL (ref 6–23)
CALCIUM: 7.8 mg/dL — AB (ref 8.4–10.5)
CO2: 25 mmol/L (ref 19–32)
Chloride: 106 mmol/L (ref 96–112)
Creatinine, Ser: 1.58 mg/dL — ABNORMAL HIGH (ref 0.50–1.35)
GFR calc non Af Amer: 44 mL/min — ABNORMAL LOW (ref >90)
GFR, EST AFRICAN AMERICAN: 51 mL/min — AB (ref >90)
Glucose, Bld: 132 mg/dL — ABNORMAL HIGH (ref 70–99)
POTASSIUM: 3.5 mmol/L (ref 3.5–5.1)
Sodium: 137 mmol/L (ref 135–145)

## 2014-12-19 LAB — CBC
HEMATOCRIT: 26.3 % — AB (ref 39.0–52.0)
Hemoglobin: 7.6 g/dL — ABNORMAL LOW (ref 13.0–17.0)
MCH: 22.2 pg — ABNORMAL LOW (ref 26.0–34.0)
MCHC: 28.9 g/dL — ABNORMAL LOW (ref 30.0–36.0)
MCV: 76.9 fL — ABNORMAL LOW (ref 78.0–100.0)
PLATELETS: 187 10*3/uL (ref 150–400)
RBC: 3.42 MIL/uL — ABNORMAL LOW (ref 4.22–5.81)
RDW: 18.5 % — ABNORMAL HIGH (ref 11.5–15.5)
WBC: 7.1 10*3/uL (ref 4.0–10.5)

## 2014-12-19 LAB — GLUCOSE, CAPILLARY
Glucose-Capillary: 144 mg/dL — ABNORMAL HIGH (ref 70–99)
Glucose-Capillary: 164 mg/dL — ABNORMAL HIGH (ref 70–99)
Glucose-Capillary: 188 mg/dL — ABNORMAL HIGH (ref 70–99)
Glucose-Capillary: 145 mg/dL — ABNORMAL HIGH (ref 70–99)

## 2014-12-19 LAB — BRAIN NATRIURETIC PEPTIDE: B NATRIURETIC PEPTIDE 5: 1846.4 pg/mL — AB (ref 0.0–100.0)

## 2014-12-19 MED ORDER — RAMIPRIL 2.5 MG PO CAPS
2.5000 mg | ORAL_CAPSULE | Freq: Every day | ORAL | Status: DC
  Administered 2014-12-19 – 2014-12-20 (×2): 2.5 mg via ORAL
  Filled 2014-12-19 (×2): qty 1

## 2014-12-19 NOTE — Progress Notes (Signed)
Subjective:  Patient denies any chest pain or shortness of breath. Diuresing well with IV milrinone.. Occasional few beats of nonsustained VT on the monitor. Renal function also improved. Leg swelling still persist  Objective:  Vital Signs in the last 24 hours: Temp:  [97.8 F (36.6 C)-98 F (36.7 C)] 97.8 F (36.6 C) (03/19 0420) Pulse Rate:  [88-95] 91 (03/19 0852) Resp:  [18-20] 20 (03/19 0420) BP: (113-125)/(66-73) 120/73 mmHg (03/19 0420) SpO2:  [99 %-100 %] 100 % (03/19 0420) Weight:  [74.4 kg (164 lb 0.4 oz)] 74.4 kg (164 lb 0.4 oz) (03/19 0420)  Intake/Output from previous day: 03/18 0701 - 03/19 0700 In: 480 [P.O.:480] Out: 3050 [Urine:3050] Intake/Output from this shift: Total I/O In: 240 [P.O.:240] Out: -   Physical Exam: Neck: no adenopathy, no carotid bruit and supple, symmetrical, trachea midline Lungs: Decreased breath sound at bases Heart: regular rate and rhythm, S1, S2 normal and Soft systolic and diastolic murmur and S3 gallop noted Abdomen: soft, non-tender; bowel sounds normal; no masses,  no organomegaly Extremities: No clubbing cyanosis 4+ edema noted  Lab Results:  Recent Labs  12/18/14 0525 12/19/14 0335  WBC 5.3 7.1  HGB 7.6* 7.6*  PLT 195 187    Recent Labs  12/18/14 0525 12/19/14 0335  NA 140 137  K 3.2* 3.5  CL 106 106  CO2 27 25  GLUCOSE 188* 132*  BUN 26* 21  CREATININE 1.90* 1.58*   No results for input(s): TROPONINI in the last 72 hours.  Invalid input(s): CK, MB Hepatic Function Panel No results for input(s): PROT, ALBUMIN, AST, ALT, ALKPHOS, BILITOT, BILIDIR, IBILI in the last 72 hours. No results for input(s): CHOL in the last 72 hours. No results for input(s): PROTIME in the last 72 hours.  Imaging: Imaging results have been reviewed and No results found.  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  LOS: 4 days    Geoffrey [name] N 12/19/2014, 11:05 AM

## 2014-12-20 LAB — BASIC METABOLIC PANEL
ANION GAP: 8 (ref 5–15)
BUN: 20 mg/dL (ref 6–23)
CO2: 24 mmol/L (ref 19–32)
Calcium: 8.3 mg/dL — ABNORMAL LOW (ref 8.4–10.5)
Chloride: 106 mmol/L (ref 96–112)
Creatinine, Ser: 1.75 mg/dL — ABNORMAL HIGH (ref 0.50–1.35)
GFR calc Af Amer: 45 mL/min — ABNORMAL LOW (ref >90)
GFR calc non Af Amer: 39 mL/min — ABNORMAL LOW (ref >90)
GLUCOSE: 129 mg/dL — AB (ref 70–99)
Potassium: 3.9 mmol/L (ref 3.5–5.1)
Sodium: 138 mmol/L (ref 135–145)

## 2014-12-20 LAB — CBC
HEMATOCRIT: 29.5 % — AB (ref 39.0–52.0)
Hemoglobin: 8.5 g/dL — ABNORMAL LOW (ref 13.0–17.0)
MCH: 22.8 pg — ABNORMAL LOW (ref 26.0–34.0)
MCHC: 28.8 g/dL — ABNORMAL LOW (ref 30.0–36.0)
MCV: 79.3 fL (ref 78.0–100.0)
PLATELETS: 235 10*3/uL (ref 150–400)
RBC: 3.72 MIL/uL — AB (ref 4.22–5.81)
RDW: 19 % — ABNORMAL HIGH (ref 11.5–15.5)
WBC: 7.6 10*3/uL (ref 4.0–10.5)

## 2014-12-20 LAB — BRAIN NATRIURETIC PEPTIDE: B NATRIURETIC PEPTIDE 5: 1384.2 pg/mL — AB (ref 0.0–100.0)

## 2014-12-20 LAB — GLUCOSE, CAPILLARY
Glucose-Capillary: 162 mg/dL — ABNORMAL HIGH (ref 70–99)
Glucose-Capillary: 128 mg/dL — ABNORMAL HIGH (ref 70–99)
Glucose-Capillary: 165 mg/dL — ABNORMAL HIGH (ref 70–99)

## 2014-12-20 MED ORDER — METOLAZONE 5 MG PO TABS
5.0000 mg | ORAL_TABLET | Freq: Every day | ORAL | Status: DC
  Administered 2014-12-20 – 2014-12-22 (×3): 5 mg via ORAL
  Filled 2014-12-20 (×4): qty 1

## 2014-12-20 NOTE — Progress Notes (Signed)
Subjective:  Denies any chest pain shortness of breath or palpitation. Still markedly volume overloaded diuresing well. Noted to have more frequent nonsustained VT is asymptomatic after increasing milrinone. Creatinine trending upward  Objective:  Vital Signs in the last 24 hours: Temp:  [97.9 F (36.6 C)-98.5 F (36.9 C)] 98.5 F (36.9 C) (03/20 0510) Pulse Rate:  [90-102] 102 (03/20 0510) Resp:  [18-20] 18 (03/20 0510) BP: (99-120)/(58-80) 120/69 mmHg (03/20 0510) SpO2:  [96 %-100 %] 96 % (03/20 0510) Weight:  [72.6 kg (160 lb 0.9 oz)] 72.6 kg (160 lb 0.9 oz) (03/20 0510)  Intake/Output from previous day: 03/19 0701 - 03/20 0700 In: 1089.6 [P.O.:720; I.V.:369.6] Out: 2235 [Urine:2235] Intake/Output from this shift: Total I/O In: 240 [P.O.:240] Out: 950 [Urine:950]  Physical Exam: Neck: JVD - 8 cm above sternal notch, no adenopathy, no carotid bruit and supple, symmetrical, trachea midline Lungs: Decreased breath sound at bases Heart: regular rate and rhythm, S1, S2 normal and Soft systolic and diastolic murmur and S3 gallop noted Abdomen: soft, non-tender; bowel sounds normal; no masses,  no organomegaly Extremities: No clubbing cyanosis 4+ edema noted  Lab Results:  Recent Labs  12/19/14 0335 12/20/14 0515  WBC 7.1 7.6  HGB 7.6* 8.5*  PLT 187 235    Recent Labs  12/19/14 0335 12/20/14 0515  NA 137 138  K 3.5 3.9  CL 106 106  CO2 25 24  GLUCOSE 132* 129*  BUN 21 20  CREATININE 1.58* 1.75*   No results for input(s): TROPONINI in the last 72 hours.  Invalid input(s): CK, MB Hepatic Function Panel No results for input(s): PROT, ALBUMIN, AST, ALT, ALKPHOS, BILITOT, BILIDIR, IBILI in the last 72 hours. No results for input(s): CHOL in the last 72 hours. No results for input(s): PROTIME in the last 72 hours.  Imaging: Imaging results have been reviewed and No results found.  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  LOS: 5 days    Geoffrey [name] N 12/20/2014, 11:31 AM

## 2014-12-20 NOTE — Progress Notes (Signed)
Pt. Had 8 beat run of non sustained VT, pt. Asymptomatic, VSS, pt. Is currently resting. Call bell within reach, RN will continue to monitor.

## 2014-12-21 LAB — CBC
HCT: 28.8 % — ABNORMAL LOW (ref 39.0–52.0)
Hemoglobin: 8.4 g/dL — ABNORMAL LOW (ref 13.0–17.0)
MCH: 23.1 pg — AB (ref 26.0–34.0)
MCHC: 29.2 g/dL — ABNORMAL LOW (ref 30.0–36.0)
MCV: 79.1 fL (ref 78.0–100.0)
Platelets: 234 10*3/uL (ref 150–400)
RBC: 3.64 MIL/uL — ABNORMAL LOW (ref 4.22–5.81)
RDW: 20.4 % — ABNORMAL HIGH (ref 11.5–15.5)
WBC: 5.9 10*3/uL (ref 4.0–10.5)

## 2014-12-21 LAB — BRAIN NATRIURETIC PEPTIDE: B Natriuretic Peptide: 1426.7 pg/mL — ABNORMAL HIGH (ref 0.0–100.0)

## 2014-12-21 LAB — COMPREHENSIVE METABOLIC PANEL
ALK PHOS: 82 U/L (ref 39–117)
ALT: 159 U/L — ABNORMAL HIGH (ref 0–53)
AST: 35 U/L (ref 0–37)
Albumin: 2.7 g/dL — ABNORMAL LOW (ref 3.5–5.2)
Anion gap: 11 (ref 5–15)
BILIRUBIN TOTAL: 1.6 mg/dL — AB (ref 0.3–1.2)
BUN: 21 mg/dL (ref 6–23)
CO2: 26 mmol/L (ref 19–32)
CREATININE: 1.68 mg/dL — AB (ref 0.50–1.35)
Calcium: 8.7 mg/dL (ref 8.4–10.5)
Chloride: 101 mmol/L (ref 96–112)
GFR, EST AFRICAN AMERICAN: 47 mL/min — AB (ref >90)
GFR, EST NON AFRICAN AMERICAN: 41 mL/min — AB (ref >90)
Glucose, Bld: 149 mg/dL — ABNORMAL HIGH (ref 70–99)
Potassium: 3 mmol/L — ABNORMAL LOW (ref 3.5–5.1)
Sodium: 138 mmol/L (ref 135–145)
Total Protein: 6.9 g/dL (ref 6.0–8.3)

## 2014-12-21 LAB — GLUCOSE, CAPILLARY
Glucose-Capillary: 211 mg/dL — ABNORMAL HIGH (ref 70–99)
Glucose-Capillary: 168 mg/dL — ABNORMAL HIGH (ref 70–99)
Glucose-Capillary: 145 mg/dL — ABNORMAL HIGH (ref 70–99)
Glucose-Capillary: 155 mg/dL — ABNORMAL HIGH (ref 70–99)

## 2014-12-21 MED ORDER — DIGOXIN 125 MCG PO TABS
0.1250 mg | ORAL_TABLET | Freq: Every day | ORAL | Status: DC
  Administered 2014-12-21 – 2014-12-22 (×2): 0.125 mg via ORAL
  Filled 2014-12-21 (×2): qty 1

## 2014-12-21 MED ORDER — POTASSIUM CHLORIDE CRYS ER 20 MEQ PO TBCR
40.0000 meq | EXTENDED_RELEASE_TABLET | Freq: Two times a day (BID) | ORAL | Status: DC
  Administered 2014-12-21 – 2014-12-22 (×2): 40 meq via ORAL
  Filled 2014-12-21 (×3): qty 2

## 2014-12-21 MED ORDER — POTASSIUM CHLORIDE CRYS ER 20 MEQ PO TBCR
40.0000 meq | EXTENDED_RELEASE_TABLET | Freq: Once | ORAL | Status: AC
  Administered 2014-12-21: 40 meq via ORAL
  Filled 2014-12-21: qty 2

## 2014-12-21 MED ORDER — FUROSEMIDE 80 MG PO TABS
80.0000 mg | ORAL_TABLET | Freq: Two times a day (BID) | ORAL | Status: DC
  Administered 2014-12-21 – 2014-12-22 (×2): 80 mg via ORAL
  Filled 2014-12-21 (×4): qty 1

## 2014-12-21 NOTE — Progress Notes (Signed)
Medicare Important Message given? YES  (If response is "NO", the following Medicare IM given date fields will be blank)  Date Medicare IM given: 12/21/14 Medicare IM given by:  Hyun Marsalis  

## 2014-12-21 NOTE — Progress Notes (Signed)
Utilization review completed.  

## 2014-12-21 NOTE — Progress Notes (Addendum)
Heart Failure Navigator Consult Note  Presentation: Geoffrey [name] is 69 year old male with past medical history significant for nonischemic dilated cardiomyopathy, history of congestive heart failure secondary to depressed LV systolic function, hypertension, diabetes mellitus, long history of alcohol abuse quit in December 2015, hypercholesteremia, chronic kidney disease stage III, tobacco abuse, came to the ER complaining of shortness of breath associated with progressive increasing leg swelling for last few weeks. Patient gives history of PND orthopnea. Denies any chest pain nausea vomiting diaphoresis. Denies abdominal pain. Patient was noted to be hypoglycemic with blood sugar in 40s and also was noted to have hemoglobin of 8.6 which was significant drop from 14.3 in March 2014. Patient denies any abdominal pain. Denies any hematuria or bright red blood per rectum or melena. Denies any hemoptysis.  Past Medical History  Diagnosis Date  . Hypertension   . Diabetes mellitus   . Coronary artery disease   . CHF (congestive heart failure)   . Irregular heartbeat   . High cholesterol   . Shortness of breath dyspnea     History   Social History  . Marital Status: Married    Spouse Name: N/A  . Number of Children: N/A  . Years of Education: N/A   Social History Main Topics  . Smoking status: Former Smoker    Quit date: 12/05/1967  . Smokeless tobacco: Never Used  . Alcohol Use: Yes     Comment: occas  . Drug Use: 3.00 per week    Special: Marijuana  . Sexual Activity: Not on file   Other Topics Concern  . None   Social History Narrative    ECHO:Study Conclusions--12/16/14  - Left ventricle: The cavity size was severely dilated. Systolic function was severely reduced. The estimated ejection fraction was in the range of 10% to 15%. Severe diffuse hypokinesis. - Aortic valve: There was mild regurgitation. - Mitral valve: There was mild to moderate regurgitation. - Left  atrium: The atrium was moderately dilated. - Atrial septum: No defect or patent foramen ovale was identified. - Tricuspid valve: There was moderate regurgitation directed toward the septum. - Pulmonic valve: There was moderate regurgitation. - Pulmonary arteries: Systolic pressure was severely increased. PA peak pressure: 71 mm Hg (S).  Transthoracic echocardiography. M-mode, complete 2D, spectral Doppler, and color Doppler. Birthdate: Patient birthdate: 10/26/1945. Age: Patient is 69 yr old. Sex: Gender: male. BMI: 26.4 kg/m^2. Blood pressure:   117/93 Patient status: Inpatient. Study date: Study date: 12/16/2014. Study time: 10:15 AM. Location: Echo laboratory.  BNP    Component Value Date/Time   BNP 1426.7* 12/21/2014 0535    ProBNP No results found for: PROBNP   Education Assessment and Provision:  Detailed education and instructions provided on heart failure disease management including the following:  Signs and symptoms of Heart Failure When to call the physician Importance of daily weights Low sodium diet Fluid restriction Medication management Anticipated future follow-up appointments  Patient education given on each of the above topics.  Patient acknowledges understanding and acceptance of all instructions.  I spoke briefly with Geoffrey [name] regarding his HF.  He says that he recently had become more "tired" and was "holding fluid".  He says he increased his Lasix dose at home and did not really see an increase in urine output.   He does not have a scale and does not weigh daily.  I have encouraged him to get a scale and stressed the importance of daily weights.  He says that he tries to eat  low sodium and can relate why it is important to avoid high sodium foods.  [place] in [place].  He tells me that he has no issues getting or taking medications as prescribed.  He also admits to drinking alcohol "now and again" -yet says he has not  had any since the "New Year".  I related that it would be best to avoid alcohol as it is detrimental for his heart.  Education Materials:  "Living Better With Heart Failure" Booklet, Daily Weight Tracker Tool    High Risk Criteria for Readmission and/or Poor Patient Outcomes:   EF <30%- Yes 10-15%  2 or more admissions in 6 months- No  Difficult social situation- No -lives with son   Demonstrates medication noncompliance- No- denies    Barriers of Care:  Knowledge, compliance and alcohol abuse  Discharge Planning:   Plans to discharge to home with son.  [facility] and possibly [facility] if he qualifies.

## 2014-12-21 NOTE — Progress Notes (Signed)
Subjective:  Patient denies any chest pain or shortness of breath and states leg swelling has improved since yesterday after starting Zaroxolyn.  Diuresing well.  Eager to go home.  Objective:  Vital Signs in the last 24 hours: Temp:  [97.5 F (36.4 C)-98.5 F (36.9 C)] 97.5 F (36.4 C) (03/21 1404) Pulse Rate:  [89-97] 89 (03/21 1404) Resp:  [18-19] 19 (03/21 1404) BP: (93-122)/(48-75) 96/48 mmHg (03/21 1404) SpO2:  [99 %-100 %] 99 % (03/21 1404) Weight:  [68.4 kg (150 lb 12.7 oz)] 68.4 kg (150 lb 12.7 oz) (03/21 0424)  Intake/Output from previous day: 03/20 0701 - 03/21 0700 In: 600 [P.O.:600] Out: 5750 [Urine:5750] Intake/Output from this shift: Total I/O In: 740 [P.O.:720; I.V.:20] Out: 1500 [Urine:1500]  Physical Exam: Neck: no adenopathy, no carotid bruit, supple, symmetrical, trachea midline and thyroid not enlarged, symmetric, no tenderness/mass/nodules Lungs: decreased breath sounds at bases Heart: regular rate and rhythm, S1, S2 normal and soft systolic and diastolic murmur and S3 gallop noted Abdomen: soft, non-tender; bowel sounds normal; no masses,  no organomegaly Extremities: no clubbing cyanosis 3+ edema noted  Lab Results:  Recent Labs  12/20/14 0515 12/21/14 0535  WBC 7.6 5.9  HGB 8.5* 8.4*  PLT 235 234    Recent Labs  12/20/14 0515 12/21/14 0535  NA 138 138  K 3.9 3.0*  CL 106 101  CO2 24 26  GLUCOSE 129* 149*  BUN 20 21  CREATININE 1.75* 1.68*   No results for input(s): TROPONINI in the last 72 hours.  Invalid input(s): CK, MB Hepatic Function Panel  Recent Labs  12/21/14 0535  PROT 6.9  ALBUMIN 2.7*  AST 35  ALT 159*  ALKPHOS 82  BILITOT 1.6*   No results for input(s): CHOL in the last 72 hours. No results for input(s): PROTIME in the last 72 hours.  Imaging: Imaging results have been reviewed and No results found.  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

## 2014-12-21 NOTE — Progress Notes (Signed)
Patient had 22 beat run of vtach. Patient sitting in chair, asymptomatic. bp 90/51and pulse 88. Patient has history of runs. Report given to night shift nurse to monitor.

## 2014-12-22 ENCOUNTER — Encounter (HOSPITAL_COMMUNITY): Payer: Self-pay | Admitting: *Deleted

## 2014-12-22 DIAGNOSIS — I5023 Acute on chronic systolic (congestive) heart failure: Secondary | ICD-10-CM | POA: Diagnosis not present

## 2014-12-22 LAB — BASIC METABOLIC PANEL
ANION GAP: 12 (ref 5–15)
BUN: 26 mg/dL — ABNORMAL HIGH (ref 6–23)
CHLORIDE: 94 mmol/L — AB (ref 96–112)
CO2: 29 mmol/L (ref 19–32)
Calcium: 9.1 mg/dL (ref 8.4–10.5)
Creatinine, Ser: 1.69 mg/dL — ABNORMAL HIGH (ref 0.50–1.35)
GFR, EST AFRICAN AMERICAN: 47 mL/min — AB (ref >90)
GFR, EST NON AFRICAN AMERICAN: 41 mL/min — AB (ref >90)
Glucose, Bld: 153 mg/dL — ABNORMAL HIGH (ref 70–99)
POTASSIUM: 3.4 mmol/L — AB (ref 3.5–5.1)
SODIUM: 135 mmol/L (ref 135–145)

## 2014-12-22 LAB — MAGNESIUM: Magnesium: 2 mg/dL (ref 1.5–2.5)

## 2014-12-22 LAB — GLUCOSE, CAPILLARY
GLUCOSE-CAPILLARY: 158 mg/dL — AB (ref 70–99)
Glucose-Capillary: 161 mg/dL — ABNORMAL HIGH (ref 70–99)

## 2014-12-22 LAB — BRAIN NATRIURETIC PEPTIDE: B NATRIURETIC PEPTIDE 5: 594 pg/mL — AB (ref 0.0–100.0)

## 2014-12-22 MED ORDER — METOLAZONE 5 MG PO TABS: 5.0000 mg | ORAL_TABLET | Freq: Every day | ORAL | Status: AC

## 2014-12-22 MED ORDER — MILRINONE IN DEXTROSE 20 MG/100ML IV SOLN: 0.1250 ug/kg/min | INTRAVENOUS | Status: DC

## 2014-12-22 MED ORDER — GLIPIZIDE 10 MG PO TABS: 10.0000 mg | ORAL_TABLET | Freq: Two times a day (BID) | ORAL | Status: AC

## 2014-12-22 MED ORDER — CARVEDILOL PHOSPHATE ER 20 MG PO CP24
20.0000 mg | ORAL_CAPSULE | Freq: Every day | ORAL | Status: AC
Start: 2014-12-22 — End: ?

## 2014-12-22 MED ORDER — POTASSIUM CHLORIDE CRYS ER 20 MEQ PO TBCR: 20.0000 meq | EXTENDED_RELEASE_TABLET | Freq: Three times a day (TID) | ORAL | Status: AC

## 2014-12-22 MED ORDER — ROSUVASTATIN CALCIUM 20 MG PO TABS: 10.0000 mg | ORAL_TABLET | Freq: Every day | ORAL | Status: AC

## 2014-12-22 MED ORDER — FERROUS SULFATE 325 (65 FE) MG PO TABS: 325.0000 mg | ORAL_TABLET | Freq: Three times a day (TID) | ORAL | Status: AC

## 2014-12-22 MED ORDER — ISOSORB DINITRATE-HYDRALAZINE 20-37.5 MG PO TABS: 0.5000 | ORAL_TABLET | Freq: Two times a day (BID) | ORAL | Status: AC

## 2014-12-22 NOTE — Progress Notes (Signed)
   12/22/14 1153  Vitals  Temp 97.9 F (36.6 C)  Temp Source Oral  BP 95/74 mmHg  MAP (mmHg) 80  BP Location Left Arm  BP Method Automatic  Patient Position (if appropriate) Sitting  Pulse Rate 92  Pulse Rate Source Dinamap  Resp 18  Oxygen Therapy  SpO2 99 %  O2 Device Room Air    Pt VS after stopping pt infusing milrinone. Pt denies any CP, SOB or discomfort. Will continue to monitor quietly. Arabella MerlesP. Amo Kylen Schliep RN.

## 2014-12-22 NOTE — Progress Notes (Signed)
Pt discharge education and instructions completed with pt and spouse at bedside. All voices understanding and denies any questions. Pt IV and telemetry removed; pt discharge home with spouse to transport him home. Pt transported off unit via wheelchair with belongings and spouse at side. Pt discharge prescription sent electronically to preferred pharmacy. Arabella MerlesP. Amo Skanda Worlds RN.

## 2014-12-22 NOTE — Discharge Instructions (Signed)

## 2014-12-22 NOTE — Discharge Summary (Signed)
Discharge summary dictated on 12/22/2014 dictation number is [phone]

## 2014-12-22 NOTE — Care Management Note (Signed)
    Page 1 of 1   12/22/2014     2:39:24 PM CARE MANAGEMENT NOTE 12/22/2014  Patient:  Geoffrey CraneGREGORY,Geoffrey [name]   Account Number:  [id number]  Date Initiated:  12/18/2014  Documentation initiated by:  Donn PieriniWEBSTER,Rivaldo Hineman  Subjective/Objective Assessment:   Pt admitted with CHF     Action/Plan:   PTA pt lived at home alone- [facility]- NCM to follow  PC has been consulted   Anticipated DC Date:  12/23/2014   Anticipated DC Plan:  HOME W HOME HEALTH SERVICES         Choice offered to / List presented to:             Status of service:  Completed, signed off Medicare Important Message given?  YES (If response is "NO", the following Medicare IM given date fields will be blank) Date Medicare IM given:  12/21/2014 Medicare IM given by:  Donn PieriniWEBSTER,Nitin Mckowen Date Additional Medicare IM given:   Additional Medicare IM given by:    Discharge Disposition:  HOME/SELF CARE  Per UR Regulation:  Reviewed for med. necessity/level of care/duration of stay  If discussed at Long Length of Stay Meetings, dates discussed:   12/22/2014    Comments:  12/22/14- 1200- Donn PieriniKristi Manasi Dishon RN, BSN [phone] Milrinone gtt weaning to off pt may d/c home later today

## 2014-12-23 NOTE — Discharge Summary (Signed)
NAMEARNEZ, STONEKING              ACCOUNT NO.:  [id number]  MEDICAL RECORD NO.:  [id number]  LOCATION:  [id number]                        FACILITY:  [facility]  PHYSICIAN:  Iline Buchinger N. Terrence Dupont, M.D. DATE OF BIRTH:  1945/10/28  DATE OF ADMISSION:  12/15/2014 DATE OF DISCHARGE:  12/22/2014                              DISCHARGE SUMMARY   ADMITTING DIAGNOSES: 1. Acute decompensated systolic heart failure. 2. Severe nonischemic dilated cardiomyopathy. 3. Hypertension. 4. Diabetes mellitus. 5. Status post hypoglycemia. 6. Alcohol abuse, rule out cirrhosis of liver. 7. Elevated liver function tests secondary to above. 8. Acute-on-chronic kidney injury. 9. Acute anemia, questionable etiology, rule out gastrointestinal     loss. 10.History of tobacco abuse.  DISCHARGE DIAGNOSES: 1. Compensated systolic heart failure. 2. Severe nonischemic dilated cardiomyopathy. 3. Hypertension. 4. Diabetes mellitus. 5. 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 6. Alcohol abuse. 7. Elevated liver function tests secondary to above. 8. Resolving chronic kidney disease, stage III. 9. Anemia of chronic disease. 10.History of tobacco abuse.  DISCHARGE MEDICATIONS: 1. Ferrous sulfate 325 mg 3 times daily. 2. BiDil 20/37.5 mg half tablet twice daily. 3. Zaroxolyn 5 mg 1 tablet daily. 4. Digoxin 0.25 mg 1 tablet daily. 5. Lasix 80 mg twice daily. 6. Glipizide 10 mg 1 tablet daily. 7. Carvedilol CR 20 mg 1 capsule daily. 8. Potassium chloride 20 mEq 3 times daily. 9. Crestor 10 mg daily. 10.The patient has been advised to stop aspirin, metformin, and     ramipril.  DIET:  Low salt, low cholesterol, 1800 calories ADA diet.  Heart failure instructions have been given.  The patient has been advised to restrict fluid to 1 L per 24 hours and monitor weight daily.  CONDITION AT DISCHARGE:  Stable.  BRIEF HISTORY AND HOSPITAL COURSE:  Mr. Fabiano Ginley is a 69 year old male with a past medical  history significant for nonischemic dilated cardiomyopathy, history of congestive heart failure secondary to depressed LV systolic function, hypertension, diabetes mellitus, and long history of alcohol abuse,  quit in December 2015 few months ago, hypercholesteremia, chronic kidney disease, stage III, and tobacco abuse.  He came to the ER complaining of shortness of breath associated with progressive increasing leg swelling for last few weeks.  The patient gives history of PND,  orthopnea.  Denies any chest pain, nausea, vomiting, or diaphoresis.  Denies abdominal pain.  The patient was noted to be hypoglycemic with a blood sugar in 40s and also was noted to have hemoglobin of 8.6 which was significant drop from 14.3 in March 2014.  The patient denies any abdominal pain.  Denies hematuria. Denies melena.  Denies bright red blood per rectum.  Denies any hemoptysis.  PAST MEDICAL HISTORY:  As above.  PHYSICAL EXAMINATION:  GENERAL:  He is alert, awake, and oriented x3. VITAL SIGNS:  Blood pressure 117/67 and pulse 79.  He is afebrile. HEENT:  Conjunctivae pink. NECK:  Supple.  Positive JVD. CARDIOVASCULAR:  Soft systolic murmur and S3 gallop. LUNGS:  He has bibasilar rales. ABDOMEN:  Soft.  Bowel sounds were present. EXTREMITIES:  There is no clubbing or cyanosis.  There is 4+ edema noted.  LABORATORY DATA:  Sodium was 142, potassium 3.9, BUN  27, creatinine 2.15, glucose was 43, repeat blood sugar was 117, his magnesium was 2.5, alk phos was 104, albumin 2.8, AST was 826, ALT was 525, total bilirubin was 2.1, hemoglobin was 8.6, hematocrit 29.8, white count of 6.4.  His BNP was more than 4500, repeat BNP on December 16, 2014, was 2375, on March 18th it was 1897, on March 20, it was 1384, today BNP is 594, which is trending down.  His last electrolytes, sodium 135, potassium 3.4, BUN 26,  creatinine 1.69, and glucose 153.  BRIEF HOSPITAL COURSE:  The patient was admitted to telemetry  unit.  The patient was started on IV Lasix and IV Milrinone with good diuresis.  GI consultation was obtained.  The patient was felt not to be a candidate for any GI interventions in view of markedly depressed LV systolic function, EF of 30% to 15%.  The patient states he has been drinking all along till December 2015, which he has quit after December 2015.  The patient is eager to go home and will be discharged home on above medications.  Will up titrate his beta blockers and start ACE inhibitors as his renal function improves as blood pressure tolerates, and as blood pressure tolerates, the patient has been given heart failure instructions and have been instructed not to drink alcohol at all to which he agrees.  We will repeat his 2D echo in few months if his EF remains low, and we will discuss with him regarding ICD.     Allegra Lai. Terrence Dupont, M.D.     MNH/MEDQ  D:  12/22/2014  T:  12/23/2014  Job:  [id number]

## 2016-01-01 DEATH — deceased
